# Patient Record
Sex: Male | Born: 1985 | Race: White | Hispanic: No | Marital: Married | Smoking: Never smoker
Health system: Southern US, Community
[De-identification: ages and names within clinical notes are randomized; demographics above are authoritative.]

## PROBLEM LIST (undated history)

## (undated) DIAGNOSIS — I10 Essential (primary) hypertension: Secondary | ICD-10-CM

## (undated) DIAGNOSIS — Z9289 Personal history of other medical treatment: Secondary | ICD-10-CM

## (undated) DIAGNOSIS — F32A Depression, unspecified: Secondary | ICD-10-CM

## (undated) DIAGNOSIS — Z8249 Family history of ischemic heart disease and other diseases of the circulatory system: Secondary | ICD-10-CM

## (undated) DIAGNOSIS — R079 Chest pain, unspecified: Secondary | ICD-10-CM

## (undated) DIAGNOSIS — K219 Gastro-esophageal reflux disease without esophagitis: Secondary | ICD-10-CM

## (undated) DIAGNOSIS — D352 Benign neoplasm of pituitary gland: Secondary | ICD-10-CM

## (undated) DIAGNOSIS — G4730 Sleep apnea, unspecified: Secondary | ICD-10-CM

## (undated) DIAGNOSIS — M48061 Spinal stenosis, lumbar region without neurogenic claudication: Secondary | ICD-10-CM

## (undated) DIAGNOSIS — M4800 Spinal stenosis, site unspecified: Secondary | ICD-10-CM

## (undated) DIAGNOSIS — E785 Hyperlipidemia, unspecified: Secondary | ICD-10-CM

## (undated) DIAGNOSIS — Z720 Tobacco use: Secondary | ICD-10-CM

## (undated) DIAGNOSIS — F419 Anxiety disorder, unspecified: Secondary | ICD-10-CM

## (undated) DIAGNOSIS — F329 Major depressive disorder, single episode, unspecified: Secondary | ICD-10-CM

## (undated) DIAGNOSIS — I493 Ventricular premature depolarization: Secondary | ICD-10-CM

## (undated) HISTORY — DX: Spinal stenosis, site unspecified: M48.00

## (undated) HISTORY — DX: Ventricular premature depolarization: I49.3

## (undated) HISTORY — DX: Benign neoplasm of pituitary gland: D35.2

## (undated) HISTORY — PX: EYE SURGERY: SHX253

## (undated) HISTORY — DX: Chest pain, unspecified: R07.9

## (undated) HISTORY — PX: OTHER SURGICAL HISTORY: SHX169

## (undated) HISTORY — DX: Personal history of other medical treatment: Z92.89

## (undated) HISTORY — DX: Depression, unspecified: F32.A

## (undated) HISTORY — DX: Essential (primary) hypertension: I10

## (undated) HISTORY — DX: Major depressive disorder, single episode, unspecified: F32.9

---

## 2003-10-02 ENCOUNTER — Other Ambulatory Visit: Payer: Self-pay

## 2004-05-15 ENCOUNTER — Emergency Department: Payer: Self-pay | Admitting: Emergency Medicine

## 2004-07-19 ENCOUNTER — Emergency Department: Payer: Self-pay | Admitting: Emergency Medicine

## 2004-08-11 ENCOUNTER — Emergency Department: Payer: Self-pay | Admitting: Emergency Medicine

## 2006-06-03 ENCOUNTER — Emergency Department: Payer: Self-pay | Admitting: Emergency Medicine

## 2007-03-28 ENCOUNTER — Emergency Department: Payer: Self-pay | Admitting: Unknown Physician Specialty

## 2007-07-24 ENCOUNTER — Emergency Department: Payer: Self-pay | Admitting: Emergency Medicine

## 2007-10-07 ENCOUNTER — Emergency Department: Payer: Self-pay | Admitting: Emergency Medicine

## 2007-10-07 ENCOUNTER — Other Ambulatory Visit: Payer: Self-pay

## 2008-04-13 ENCOUNTER — Emergency Department: Payer: Self-pay | Admitting: Emergency Medicine

## 2009-01-01 ENCOUNTER — Emergency Department: Payer: Self-pay | Admitting: Emergency Medicine

## 2009-07-12 ENCOUNTER — Emergency Department: Payer: Self-pay | Admitting: Internal Medicine

## 2010-04-18 ENCOUNTER — Emergency Department: Payer: Self-pay | Admitting: Emergency Medicine

## 2011-04-06 ENCOUNTER — Emergency Department: Payer: Self-pay | Admitting: *Deleted

## 2012-05-25 ENCOUNTER — Ambulatory Visit: Payer: Self-pay | Admitting: General Practice

## 2012-05-30 ENCOUNTER — Ambulatory Visit: Payer: Self-pay | Admitting: General Practice

## 2012-06-13 ENCOUNTER — Ambulatory Visit: Payer: Self-pay | Admitting: General Practice

## 2012-06-19 ENCOUNTER — Ambulatory Visit: Payer: Self-pay | Admitting: General Practice

## 2012-06-19 LAB — CREATININE, SERUM
EGFR (African American): >60
EGFR (Non-African Amer.): >60

## 2012-07-11 ENCOUNTER — Ambulatory Visit: Payer: Self-pay | Admitting: General Practice

## 2013-01-23 ENCOUNTER — Other Ambulatory Visit: Payer: Self-pay | Admitting: Emergency Medicine

## 2013-01-23 LAB — BASIC METABOLIC PANEL
Anion Gap: 6 — ABNORMAL LOW (ref 7–16)
Chloride: 105 mmol/L (ref 98–107)
Co2: 27 mmol/L (ref 21–32)
Creatinine: 1.05 mg/dL (ref 0.60–1.30)
Glucose: 100 mg/dL — ABNORMAL HIGH (ref 65–99)
Osmolality: 275 (ref 275–301)
Potassium: 3.5 mmol/L (ref 3.5–5.1)
Sodium: 138 mmol/L (ref 136–145)

## 2013-07-18 ENCOUNTER — Emergency Department: Payer: Self-pay | Admitting: Emergency Medicine

## 2013-11-06 ENCOUNTER — Ambulatory Visit: Payer: Self-pay | Admitting: General Practice

## 2013-11-19 DIAGNOSIS — M48061 Spinal stenosis, lumbar region without neurogenic claudication: Secondary | ICD-10-CM | POA: Insufficient documentation

## 2014-04-16 ENCOUNTER — Emergency Department: Payer: Self-pay | Admitting: Emergency Medicine

## 2014-04-17 LAB — COMPREHENSIVE METABOLIC PANEL
ALT: 36 U/L
AST: 19 U/L (ref 15–37)
Albumin: 3.7 g/dL (ref 3.4–5.0)
Alkaline Phosphatase: 57 U/L
Anion Gap: 7 (ref 7–16)
BUN: 13 mg/dL (ref 7–18)
Bilirubin,Total: 0.8 mg/dL (ref 0.2–1.0)
CHLORIDE: 105 mmol/L (ref 98–107)
CO2: 27 mmol/L (ref 21–32)
CREATININE: 1.01 mg/dL (ref 0.60–1.30)
Calcium, Total: 8.5 mg/dL (ref 8.5–10.1)
EGFR (African American): >60
EGFR (Non-African Amer.): >60
Glucose: 95 mg/dL (ref 65–99)
OSMOLALITY: 277 (ref 275–301)
Potassium: 3.7 mmol/L (ref 3.5–5.1)
Sodium: 139 mmol/L (ref 136–145)
Total Protein: 7.6 g/dL (ref 6.4–8.2)

## 2014-04-17 LAB — CBC WITH DIFFERENTIAL/PLATELET
BASOS ABS: 0 10*3/uL (ref 0.0–0.1)
BASOS PCT: 0.4 %
Eosinophil #: 0.1 10*3/uL (ref 0.0–0.7)
Eosinophil %: 0.7 %
HCT: 43.3 % (ref 40.0–52.0)
HGB: 14 g/dL (ref 13.0–18.0)
LYMPHS ABS: 1 10*3/uL (ref 1.0–3.6)
Lymphocyte %: 8.4 %
MCH: 28.8 pg (ref 26.0–34.0)
MCHC: 32.3 g/dL (ref 32.0–36.0)
MCV: 89 fL (ref 80–100)
MONO ABS: 0.8 x10 3/mm (ref 0.2–1.0)
Monocyte %: 7.5 %
NEUTROS PCT: 83 %
Neutrophil #: 9.4 10*3/uL — ABNORMAL HIGH (ref 1.4–6.5)
PLATELETS: 199 10*3/uL (ref 150–440)
RBC: 4.86 10*6/uL (ref 4.40–5.90)
RDW: 12.5 % (ref 11.5–14.5)
WBC: 11.3 10*3/uL — ABNORMAL HIGH (ref 3.8–10.6)

## 2014-08-12 ENCOUNTER — Emergency Department: Payer: Self-pay | Admitting: Emergency Medicine

## 2014-08-15 ENCOUNTER — Encounter: Payer: Self-pay | Admitting: Cardiovascular Disease

## 2014-08-15 ENCOUNTER — Ambulatory Visit (INDEPENDENT_AMBULATORY_CARE_PROVIDER_SITE_OTHER): Payer: No Typology Code available for payment source | Admitting: Cardiovascular Disease

## 2014-08-15 ENCOUNTER — Encounter (INDEPENDENT_AMBULATORY_CARE_PROVIDER_SITE_OTHER): Payer: Self-pay

## 2014-08-15 VITALS — BP 146/92 | HR 75 | Ht 70.0 in | Wt 301.8 lb

## 2014-08-15 DIAGNOSIS — R079 Chest pain, unspecified: Secondary | ICD-10-CM

## 2014-08-15 DIAGNOSIS — I10 Essential (primary) hypertension: Secondary | ICD-10-CM

## 2014-08-15 DIAGNOSIS — R0602 Shortness of breath: Secondary | ICD-10-CM

## 2014-08-15 MED ORDER — LISINOPRIL-HYDROCHLOROTHIAZIDE 10-12.5 MG PO TABS: 1.0000 | ORAL_TABLET | Freq: Every day | ORAL | Status: DC

## 2014-08-15 NOTE — Patient Instructions (Addendum)
Your physician has requested that you have an exercise tolerance test. For further information please visit [email or web address]. Please also follow instruction sheet, as given. -eat a small meal  -take your medications  -wear gym cloths and lace up walking shoes  -no lotion    Your physician has requested that you have an echocardiogram. Echocardiography is a painless test that uses sound waves to create images of your heart. It provides your doctor with information about the size and shape of your heart and how well your heart's chambers and valves are working. This procedure takes approximately one hour. There are no restrictions for this procedure.   Your physician has recommended you make the following change in your medication:  Restart Lisinopril/HCT 10/12.5 once daily   Your physician recommends that you schedule a follow-up appointment in:  After your tests

## 2014-08-18 DIAGNOSIS — I10 Essential (primary) hypertension: Secondary | ICD-10-CM | POA: Insufficient documentation

## 2014-08-18 NOTE — Assessment & Plan Note (Signed)
His chest pain is overall atypical and could be musculoskeletal. However, given his risk factors for coronary artery disease I requested a treadmill stress test. I discussed with the patient the importance of lifestyle changes in order to decrease the chance of future coronary artery disease and cardiovascular events. We discussed the importance of controlling risk factors, healthy diet as well as regular exercise. I also explained to him that a normal stress test does not rule out atherosclerosis.

## 2014-08-18 NOTE — Assessment & Plan Note (Signed)
I resumed lisinopril hydrochlorothiazide. I discussed with the patient the importance of regular exercise and weight loss.

## 2014-08-18 NOTE — Progress Notes (Signed)
   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 He has no previous cardiac history. He has known history of hypertension and used to be on small dose lisinopril-hydrochlorothiazide, sleep apnea currently not using CPAP, morbid obesity and tobacco dipping. He does have family history of premature coronary artery disease and his father is actually one of my patients. He had myocardial infarction at the age of 110. The patient had chest pain early this week. It was substernal radiating to his back and described as aching. 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 The episode happened at rest. He went to the emergency room at [facility]. ECG was normal. His labs were unremarkable including negative troponin and normal d-dimer. Chest x-ray showed no acute abnormalities. He complains of exertional dyspnea without chest pain with activities. He does not exercise on a regular basis.  No Known Allergies   No current outpatient prescriptions on file prior to visit.   No current facility-administered medications on file prior to visit.     Past Medical History  Diagnosis Date  . Hypertension      History reviewed. No pertinent past surgical history.   Family History  Problem Relation Age of Onset  . Heart disease Father   . Heart attack Father      History   Social History  . Marital Status: Single    Spouse Name: N/A  . Number of Children: N/A  . Years of Education: N/A   Occupational History  . Not on file.   Social History Main Topics  . Smoking status: Never Smoker   . Smokeless tobacco: Current User    Types: Chew  . Alcohol Use: Yes  . Drug Use: No  . Sexual Activity: Not on file   Other Topics Concern  . Not on file   Social History Narrative  . No narrative on file     ROS A 10 point review of system was performed. It is negative other than that mentioned in the history of present  illness.   PHYSICAL EXAM   BP 146/92 mmHg  Pulse 75  Ht 5\' 10"  (1.778 m)  Wt 301 lb 12 oz (136.873 kg)  BMI 43.30 kg/m2 Constitutional: He is oriented to person, place, and time. He appears well-developed and well-nourished. No distress.  HENT: No nasal discharge.  Head: Normocephalic and atraumatic.  Eyes: Pupils are equal and round.  No discharge. Neck: Normal range of motion. Neck supple. No JVD present. No thyromegaly present.  Cardiovascular: Normal rate, regular rhythm, normal heart sounds. Exam reveals no gallop and no friction rub. No murmur heard.  Pulmonary/Chest: Effort normal and breath sounds normal. No stridor. No respiratory distress. He has no wheezes. He has no rales. He exhibits no tenderness.  Abdominal: Soft. Bowel sounds are normal. He exhibits no distension. There is no tenderness. There is no rebound and no guarding.  Musculoskeletal: Normal range of motion. He exhibits no edema and no tenderness.  Neurological: He is alert and oriented to person, place, and time. Coordination normal.  Skin: Skin is warm and dry. No rash noted. He is not diaphoretic. No erythema. No pallor.  Psychiatric: He has a normal mood and affect. His behavior is normal. Judgment and thought content normal.       [id number]  Rhythm  WITHIN NORMAL LIMITS   ASSESSMENT AND PLAN

## 2014-08-18 NOTE — Assessment & Plan Note (Signed)
This could be due to obesity and physical deconditioning. I requested an echocardiogram to evaluate for any structural cardiac abnormalities.

## 2014-09-01 ENCOUNTER — Other Ambulatory Visit: Payer: Self-pay

## 2014-09-01 ENCOUNTER — Other Ambulatory Visit (INDEPENDENT_AMBULATORY_CARE_PROVIDER_SITE_OTHER): Payer: No Typology Code available for payment source

## 2014-09-01 ENCOUNTER — Ambulatory Visit (INDEPENDENT_AMBULATORY_CARE_PROVIDER_SITE_OTHER): Payer: No Typology Code available for payment source | Admitting: Cardiovascular Disease

## 2014-09-01 DIAGNOSIS — R079 Chest pain, unspecified: Secondary | ICD-10-CM

## 2014-09-01 DIAGNOSIS — R0602 Shortness of breath: Secondary | ICD-10-CM | POA: Diagnosis not present

## 2014-09-01 NOTE — Procedures (Signed)
   Treadmill Stress test  Indication: chest pain.  Baseline Data:  Resting EKG shows NSR with rate of 94 bpm, no significant ST or T wave changes. Resting blood pressure of 13 4/90 mm Hg Stand bruce protocal was used.  Exercise Data:  Patient exercised for 6 min 40 sec,  Peak heart rate of 169 bpm.  This was 88% of the maximum predicted heart rate. No symptoms of chest pain or lightheadedness were reported at peak stress or in recovery.  Peak Blood pressure recorded was 214/89 Maximal work level: 8 METs.  Heart rate at 3 minutes in recovery was 105 bpm. BP response: hypertensive HR response: normal  EKG with Exercise: sinus tachycardia with no significant ST changes.  FINAL IMPRESSION: Normal exercise stress test. No significant EKG changes concerning for ischemia. Below average exercise tolerance.  Recommendation: there is evidence of physical deconditioning and hypertensive response to exercise.

## 2014-09-01 NOTE — Patient Instructions (Signed)
Normal stress test

## 2014-09-04 ENCOUNTER — Ambulatory Visit (INDEPENDENT_AMBULATORY_CARE_PROVIDER_SITE_OTHER): Payer: No Typology Code available for payment source | Admitting: Cardiovascular Disease

## 2014-09-04 ENCOUNTER — Ambulatory Visit: Payer: No Typology Code available for payment source | Admitting: Cardiovascular Disease

## 2014-09-04 ENCOUNTER — Encounter: Payer: Self-pay | Admitting: Cardiovascular Disease

## 2014-09-04 VITALS — BP 138/88 | HR 74 | Ht 70.0 in | Wt 298.5 lb

## 2014-09-04 DIAGNOSIS — I10 Essential (primary) hypertension: Secondary | ICD-10-CM

## 2014-09-04 DIAGNOSIS — R0789 Other chest pain: Secondary | ICD-10-CM

## 2014-09-04 NOTE — Patient Instructions (Signed)
Follow up as needed

## 2014-09-05 ENCOUNTER — Encounter: Payer: Self-pay | Admitting: Cardiovascular Disease

## 2014-09-05 NOTE — Assessment & Plan Note (Signed)
Overall atypical. This is only happening now when he is under stress. It's not happening with physical activities. Treadmill stress test showed no evidence of ischemia. I discussed with the patient the importance of lifestyle changes in order to decrease the chance of future coronary artery disease and cardiovascular events. We discussed the importance of controlling risk factors, healthy diet as well as regular exercise. I also explained to him that a normal stress test does not rule out atherosclerosis.

## 2014-09-05 NOTE — Progress Notes (Signed)
   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 He has no previous cardiac history. He has known history of hypertension , sleep apnea currently not using CPAP, morbid obesity and tobacco dipping. He does have family history of premature coronary artery disease and his father is actually one of my patients. He was seen recently for atypical chest pain and exertional dyspnea. He underwent a treadmill stress test which showed no evidence of ischemia. There was evidence of physical deconditioning and hypertensive response to exercise. Echocardiogram was unremarkable except for mild left ventricular hypertrophy.  No Known Allergies   Current Outpatient Prescriptions on File Prior to Visit  Medication Sig Dispense Refill  . lisinopril-hydrochlorothiazide (PRINZIDE,ZESTORETIC) 10-12.5 MG per tablet Take 1 tablet by mouth daily. 30 tablet 6   No current facility-administered medications on file prior to visit.     Past Medical History  Diagnosis Date  . Hypertension      History reviewed. No pertinent past surgical history.   Family History  Problem Relation Age of Onset  . Heart disease Father   . Heart attack Father      History   Social History  . Marital Status: Single    Spouse Name: N/A  . Number of Children: N/A  . Years of Education: N/A   Occupational History  . Not on file.   Social History Main Topics  . Smoking status: Never Smoker   . Smokeless tobacco: Current User    Types: Chew  . Alcohol Use: Yes  . Drug Use: No  . Sexual Activity: Not on file   Other Topics Concern  . Not on file   Social History Narrative     ROS A 10 point review of system was performed. It is negative other than that mentioned in the history of present illness.   PHYSICAL EXAM   BP 138/88 mmHg  Pulse 74  Ht 5\' 10"  (1.778 m)  Wt 298 lb 8 oz (135.399 kg)  BMI 42.83 kg/m2 Constitutional: He is oriented to  person, place, and time. He appears well-developed and well-nourished. No distress.  HENT: No nasal discharge.  Head: Normocephalic and atraumatic.  Eyes: Pupils are equal and round.  No discharge. Neck: Normal range of motion. Neck supple. No JVD present. No thyromegaly present.  Cardiovascular: Normal rate, regular rhythm, normal heart sounds. Exam reveals no gallop and no friction rub. No murmur heard.  Pulmonary/Chest: Effort normal and breath sounds normal. No stridor. No respiratory distress. He has no wheezes. He has no rales. He exhibits no tenderness.  Abdominal: Soft. Bowel sounds are normal. He exhibits no distension. There is no tenderness. There is no rebound and no guarding.  Musculoskeletal: Normal range of motion. He exhibits no edema and no tenderness.  Neurological: He is alert and oriented to person, place, and time. Coordination normal.  Skin: Skin is warm and dry. No rash noted. He is not diaphoretic. No erythema. No pallor.  Psychiatric: He has a normal mood and affect. His behavior is normal. Judgment and thought content normal.         ASSESSMENT AND PLAN

## 2014-09-05 NOTE — Assessment & Plan Note (Signed)
Blood pressure is better controlled since he resumed Lisinopril-hydrochlorothiazide.

## 2014-09-08 ENCOUNTER — Ambulatory Visit: Payer: Self-pay | Admitting: Gastroenterology

## 2014-12-29 ENCOUNTER — Encounter: Payer: Self-pay | Admitting: General Practice

## 2014-12-29 ENCOUNTER — Emergency Department
Admission: EM | Admit: 2014-12-29 | Discharge: 2014-12-29 | Disposition: A | Discharge status: Home / Self Care | Payer: No Typology Code available for payment source | Attending: Emergency Medicine | Admitting: Emergency Medicine

## 2014-12-29 DIAGNOSIS — R1110 Vomiting, unspecified: Secondary | ICD-10-CM | POA: Diagnosis not present

## 2014-12-29 DIAGNOSIS — I10 Essential (primary) hypertension: Secondary | ICD-10-CM | POA: Diagnosis not present

## 2014-12-29 DIAGNOSIS — R109 Unspecified abdominal pain: Secondary | ICD-10-CM | POA: Diagnosis not present

## 2014-12-29 DIAGNOSIS — R509 Fever, unspecified: Secondary | ICD-10-CM | POA: Diagnosis present

## 2014-12-29 DIAGNOSIS — J039 Acute tonsillitis, unspecified: Secondary | ICD-10-CM | POA: Diagnosis not present

## 2014-12-29 LAB — CBC WITH DIFFERENTIAL/PLATELET
BASOS ABS: 0.1 10*3/uL (ref 0–0.1)
Basophils Relative: 0 %
EOS PCT: 1 %
Eosinophils Absolute: 0.1 10*3/uL (ref 0–0.7)
HEMATOCRIT: 46.2 % (ref 40.0–52.0)
HEMOGLOBIN: 15.6 g/dL (ref 13.0–18.0)
LYMPHS PCT: 8 %
Lymphs Abs: 1.3 10*3/uL (ref 1.0–3.6)
MCH: 29.7 pg (ref 26.0–34.0)
MCHC: 33.9 g/dL (ref 32.0–36.0)
MCV: 87.7 fL (ref 80.0–100.0)
MONO ABS: 1.1 10*3/uL — AB (ref 0.2–1.0)
MONOS PCT: 7 %
NEUTROS ABS: 14 10*3/uL — AB (ref 1.4–6.5)
Neutrophils Relative %: 84 %
Platelets: 218 10*3/uL (ref 150–440)
RBC: 5.26 MIL/uL (ref 4.40–5.90)
RDW: 12.8 % (ref 11.5–14.5)
WBC: 16.5 10*3/uL — AB (ref 3.8–10.6)

## 2014-12-29 LAB — COMPREHENSIVE METABOLIC PANEL
ALT: 38 U/L (ref 17–63)
ANION GAP: 12 (ref 5–15)
AST: 25 U/L (ref 15–41)
Albumin: 4.4 g/dL (ref 3.5–5.0)
Alkaline Phosphatase: 54 U/L (ref 38–126)
BUN: 15 mg/dL (ref 6–20)
CALCIUM: 9.1 mg/dL (ref 8.9–10.3)
CO2: 23 mmol/L (ref 22–32)
Chloride: 101 mmol/L (ref 101–111)
Creatinine, Ser: 0.97 mg/dL (ref 0.61–1.24)
GFR calc Af Amer: >60 mL/min (ref >60)
GFR calc non Af Amer: >60 mL/min (ref >60)
GLUCOSE: 94 mg/dL (ref 65–99)
Potassium: 3.8 mmol/L (ref 3.5–5.1)
Sodium: 136 mmol/L (ref 135–145)
TOTAL PROTEIN: 7.9 g/dL (ref 6.5–8.1)
Total Bilirubin: 1.1 mg/dL (ref 0.3–1.2)

## 2014-12-29 LAB — MONONUCLEOSIS SCREEN: MONO SCREEN: NEGATIVE

## 2014-12-29 MED ORDER — DEXAMETHASONE SODIUM PHOSPHATE 10 MG/ML IJ SOLN
10.0000 mg | Freq: Once | INTRAMUSCULAR | Status: AC
  Administered 2014-12-29: 10 mg via INTRAVENOUS

## 2014-12-29 MED ORDER — KETOROLAC TROMETHAMINE 30 MG/ML IJ SOLN
30.0000 mg | Freq: Once | INTRAMUSCULAR | Status: AC
  Administered 2014-12-29: 30 mg via INTRAVENOUS

## 2014-12-29 MED ORDER — ACETAMINOPHEN 325 MG PO TABS
650.0000 mg | ORAL_TABLET | Freq: Four times a day (QID) | ORAL | Status: DC | PRN
  Administered 2014-12-29: 650 mg via ORAL

## 2014-12-29 MED ORDER — PREDNISONE 50 MG PO TABS: ORAL_TABLET | ORAL | Status: DC

## 2014-12-29 MED ORDER — AMOXICILLIN-POT CLAVULANATE 875-125 MG PO TABS: 1.0000 | ORAL_TABLET | Freq: Two times a day (BID) | ORAL | Status: AC

## 2014-12-29 MED ORDER — SODIUM CHLORIDE 0.9 % IV SOLN
Freq: Once | INTRAVENOUS | Status: AC
  Administered 2014-12-29: 19:00:00 via INTRAVENOUS

## 2014-12-29 MED ORDER — CEFTRIAXONE SODIUM IN DEXTROSE 20 MG/ML IV SOLN
1.0000 g | Freq: Once | INTRAVENOUS | Status: AC
  Administered 2014-12-29: 1 g via INTRAVENOUS
  Filled 2014-12-29: qty 50

## 2014-12-29 MED ORDER — OXYCODONE-ACETAMINOPHEN 5-325 MG PO TABS: 1.0000 | ORAL_TABLET | Freq: Four times a day (QID) | ORAL | Status: DC | PRN

## 2014-12-29 NOTE — ED Notes (Signed)
Pt placed on med hold until 2130, Pt verbalized understanding at this time, no further needs.

## 2014-12-29 NOTE — ED Provider Notes (Signed)
[facility] Emergency Department Provider Note     Time seen: ----------------------------------------- 6:36 PM on 12/29/2014 -----------------------------------------    I have reviewed the triage vital signs and the nursing notes.   HISTORY  Chief Complaint Fever; Nausea; Emesis; and Abdominal Pain    HPI [place] DUCRE is a 29 y.o. male who presents ER for fever, nausea vomiting, sore throat and abdominal pain. Patient states had a fever since Saturday, feeling weak and having joint pains. Patient also has had ticks taking off of him, as well as some amoxicillin for possible Lyme disease. Patient denies any other complaints, is having a lot of sore throat.   Past Medical History  Diagnosis Date  . Hypertension     Patient Active Problem List   Diagnosis Date Noted  . Pain in the chest 08/18/2014  . SOB (shortness of breath) 08/18/2014  . Essential hypertension 08/18/2014    History reviewed. No pertinent past surgical history.  Allergies Review of patient's allergies indicates no known allergies.  Social History History  Substance Use Topics  . Smoking status: Never Smoker   . Smokeless tobacco: Current User    Types: Chew  . Alcohol Use: Yes    Review of Systems Constitutional: Nausea for fever. Eyes: Negative for visual changes. ENT: Positive for sore throat Cardiovascular: Negative for chest pain. Respiratory: Negative for shortness of breath. Gastrointestinal: Positive for abdominal pain and vomiting Genitourinary: Negative for dysuria. Musculoskeletal: Negative for back pain. Skin: Negative for rash. Neurological: Negative for headaches, focal weakness or numbness.  10-point ROS otherwise negative.  ____________________________________________   PHYSICAL EXAM:  VITAL SIGNS: ED Triage Vitals  Enc Vitals Group     BP 12/29/14 1808 132/91 mmHg     Pulse Rate 12/29/14 1808 116     Resp 12/29/14 1808 21     Temp  12/29/14 1808 102.5 F (39.2 C)     Temp Source 12/29/14 1808 Oral     SpO2 12/29/14 1808 94 %     Weight 12/29/14 1808 285 lb (129.275 kg)     Height 12/29/14 1808 5\' 10"  (1.778 m)     Head Cir --      Peak Flow --      Pain Score 12/29/14 1809 8     Pain Loc --      Pain Edu? --      Excl. in [place]? --     Constitutional: Alert and oriented. Well appearing and in no distress. Eyes: Conjunctivae are normal. PERRL. Normal extraocular movements. ENT   Head: Normocephalic and atraumatic.   Nose: No congestion/rhinnorhea.   Mouth/Throat: There is exudate on the tonsils, marked pharyngeal erythema   Neck: No stridor. Hematological/Lymphatic/Immunilogical positive for cervical lymphadenopathy bilaterally anteriorly. Cardiovascular: Normal rate, regular rhythm. Normal and symmetric distal pulses are present in all extremities. No murmurs, rubs, or gallops. Respiratory: Normal respiratory effort without tachypnea nor retractions. Breath sounds are clear and equal bilaterally. No wheezes/rales/rhonchi. Gastrointestinal: Soft and nontender. No distention. No abdominal bruits. There is no CVA tenderness. Musculoskeletal: Nontender with normal range of motion in all extremities. No joint effusions.  No lower extremity tenderness nor edema. Neurologic:  Normal speech and language. No gross focal neurologic deficits are appreciated. Speech is normal. No gait instability. Skin:  Skin is warm, dry and intact. No rash noted. Psychiatric: Mood and affect are normal. Speech and behavior are normal. Patient exhibits appropriate insight and judgment.  ____________________________________________  ED COURSE:  Pertinent labs & imaging  results that were available during my care of the patient were reviewed by me and considered in my medical decision making (see chart for details). Likely strep pharyngitis, we'll give normal saline, Toradol and  Decadron. ____________________________________________    LABS (pertinent positives/negatives)  Labs Reviewed  CBC WITH DIFFERENTIAL/PLATELET - Abnormal; Notable for the following:    WBC 16.5 (*)    Neutro Abs 14.0 (*)    Monocytes Absolute 1.1 (*)    All other components within normal limits  COMPREHENSIVE METABOLIC PANEL  MONONUCLEOSIS SCREEN    RADIOLOGY None  ____________________________________________  FINAL ASSESSMENT AND PLAN  Strep pharyngitis  Plan: Clinically patient was strep pharyngitis. We'll diagnosed with tonsillitis, strep clinically care testing has been negative, but will likely grow out strep culture. He is feeling better after medications including saline, Toradol, Decadron, Rocephin. He'll be discharged with Augmentin, Percocet and prednisone. He is stable for outpatient follow-up.   Earleen Newport, MD   Earleen Newport, MD 12/29/14 2033

## 2014-12-29 NOTE — Discharge Instructions (Signed)

## 2014-12-29 NOTE — ED Notes (Signed)
Pt. Arrived to ed from work. Pt verbalized that since Saturday he has been experiencing nausea & vomiting, sore throat, and RUQ pain. PT alert and oriented. Pt reports experiencing a fever since Saturday. Pt seen by clinic today and placed on amoxicillin for possible lyme disease.

## 2015-03-04 ENCOUNTER — Observation Stay
Admission: EM | Admit: 2015-03-04 | Discharge: 2015-03-05 | Disposition: A | Discharge status: Home / Self Care | Payer: PRIVATE HEALTH INSURANCE | Attending: Internal Medicine | Admitting: Internal Medicine

## 2015-03-04 ENCOUNTER — Encounter: Payer: Self-pay | Admitting: *Deleted

## 2015-03-04 DIAGNOSIS — E785 Hyperlipidemia, unspecified: Secondary | ICD-10-CM | POA: Insufficient documentation

## 2015-03-04 DIAGNOSIS — Z951 Presence of aortocoronary bypass graft: Secondary | ICD-10-CM | POA: Insufficient documentation

## 2015-03-04 DIAGNOSIS — G4730 Sleep apnea, unspecified: Secondary | ICD-10-CM | POA: Insufficient documentation

## 2015-03-04 DIAGNOSIS — Z7952 Long term (current) use of systemic steroids: Secondary | ICD-10-CM | POA: Insufficient documentation

## 2015-03-04 DIAGNOSIS — Z79899 Other long term (current) drug therapy: Secondary | ICD-10-CM | POA: Insufficient documentation

## 2015-03-04 DIAGNOSIS — I10 Essential (primary) hypertension: Secondary | ICD-10-CM | POA: Insufficient documentation

## 2015-03-04 DIAGNOSIS — Z6841 Body Mass Index (BMI) 40.0 and over, adult: Secondary | ICD-10-CM | POA: Diagnosis not present

## 2015-03-04 DIAGNOSIS — G4733 Obstructive sleep apnea (adult) (pediatric): Secondary | ICD-10-CM | POA: Insufficient documentation

## 2015-03-04 DIAGNOSIS — R079 Chest pain, unspecified: Secondary | ICD-10-CM | POA: Diagnosis not present

## 2015-03-04 DIAGNOSIS — Z79891 Long term (current) use of opiate analgesic: Secondary | ICD-10-CM | POA: Diagnosis not present

## 2015-03-04 DIAGNOSIS — Z8249 Family history of ischemic heart disease and other diseases of the circulatory system: Secondary | ICD-10-CM | POA: Insufficient documentation

## 2015-03-04 DIAGNOSIS — I2510 Atherosclerotic heart disease of native coronary artery without angina pectoris: Secondary | ICD-10-CM | POA: Insufficient documentation

## 2015-03-04 DIAGNOSIS — I209 Angina pectoris, unspecified: Secondary | ICD-10-CM

## 2015-03-04 DIAGNOSIS — I200 Unstable angina: Secondary | ICD-10-CM

## 2015-03-04 HISTORY — DX: Sleep apnea, unspecified: G47.30

## 2015-03-04 HISTORY — DX: Morbid (severe) obesity due to excess calories: E66.01

## 2015-03-04 HISTORY — DX: Family history of ischemic heart disease and other diseases of the circulatory system: Z82.49

## 2015-03-04 HISTORY — DX: Tobacco use: Z72.0

## 2015-03-04 HISTORY — DX: Hyperlipidemia, unspecified: E78.5

## 2015-03-04 LAB — CBC
HEMATOCRIT: 44.8 % (ref 40.0–52.0)
Hemoglobin: 15.1 g/dL (ref 13.0–18.0)
MCH: 29.4 pg (ref 26.0–34.0)
MCHC: 33.7 g/dL (ref 32.0–36.0)
MCV: 87.3 fL (ref 80.0–100.0)
PLATELETS: 235 10*3/uL (ref 150–440)
RBC: 5.14 MIL/uL (ref 4.40–5.90)
RDW: 12.5 % (ref 11.5–14.5)
WBC: 10.4 10*3/uL (ref 3.8–10.6)

## 2015-03-04 LAB — COMPREHENSIVE METABOLIC PANEL
ALBUMIN: 4.2 g/dL (ref 3.5–5.0)
ALT: 33 U/L (ref 17–63)
AST: 31 U/L (ref 15–41)
Alkaline Phosphatase: 58 U/L (ref 38–126)
Anion gap: 10 (ref 5–15)
BILIRUBIN TOTAL: 0.6 mg/dL (ref 0.3–1.2)
BUN: 13 mg/dL (ref 6–20)
CHLORIDE: 103 mmol/L (ref 101–111)
CO2: 26 mmol/L (ref 22–32)
Calcium: 9.6 mg/dL (ref 8.9–10.3)
Creatinine, Ser: 1.16 mg/dL (ref 0.61–1.24)
GFR calc Af Amer: >60 mL/min (ref >60)
GFR calc non Af Amer: >60 mL/min (ref >60)
GLUCOSE: 113 mg/dL — AB (ref 65–99)
POTASSIUM: 3.6 mmol/L (ref 3.5–5.1)
Sodium: 139 mmol/L (ref 135–145)
Total Protein: 7.6 g/dL (ref 6.5–8.1)

## 2015-03-04 LAB — MAGNESIUM: Magnesium: 2.3 mg/dL (ref 1.7–2.4)

## 2015-03-04 LAB — LIPID PANEL
Cholesterol: 169 mg/dL (ref 0–200)
HDL: 28 mg/dL — ABNORMAL LOW (ref >40)
LDL CALC: 106 mg/dL — AB (ref 0–99)
TRIGLYCERIDES: 174 mg/dL — AB (ref <150)
Total CHOL/HDL Ratio: 6 RATIO
VLDL: 35 mg/dL (ref 0–40)

## 2015-03-04 LAB — TROPONIN I: Troponin I: <0.03 ng/mL (ref <0.031)

## 2015-03-04 MED ORDER — LISINOPRIL-HYDROCHLOROTHIAZIDE 10-12.5 MG PO TABS: 1.0000 | ORAL_TABLET | Freq: Every day | ORAL | Status: DC

## 2015-03-04 MED ORDER — HYDROCHLOROTHIAZIDE 12.5 MG PO CAPS
12.5000 mg | ORAL_CAPSULE | Freq: Every day | ORAL | Status: DC
  Administered 2015-03-05: 12.5 mg via ORAL
  Filled 2015-03-04: qty 1

## 2015-03-04 MED ORDER — ENOXAPARIN SODIUM 100 MG/ML ~~LOC~~ SOLN
100.0000 mg | Freq: Once | SUBCUTANEOUS | Status: AC
  Administered 2015-03-04: 100 mg via SUBCUTANEOUS
  Filled 2015-03-04: qty 1

## 2015-03-04 MED ORDER — LISINOPRIL 10 MG PO TABS
10.0000 mg | ORAL_TABLET | Freq: Every day | ORAL | Status: DC
  Administered 2015-03-05: 10 mg via ORAL
  Filled 2015-03-04: qty 1

## 2015-03-04 MED ORDER — ASPIRIN EC 325 MG PO TBEC
325.0000 mg | DELAYED_RELEASE_TABLET | Freq: Every day | ORAL | Status: DC
  Administered 2015-03-05: 325 mg via ORAL
  Filled 2015-03-04 (×2): qty 1

## 2015-03-04 MED ORDER — NITROGLYCERIN 0.4 MG SL SUBL: 0.4000 mg | SUBLINGUAL_TABLET | SUBLINGUAL | Status: DC | PRN

## 2015-03-04 MED ORDER — ASPIRIN 81 MG PO CHEW
324.0000 mg | CHEWABLE_TABLET | Freq: Once | ORAL | Status: AC
  Administered 2015-03-04: 324 mg via ORAL
  Filled 2015-03-04: qty 4

## 2015-03-04 NOTE — ED Notes (Addendum)
Pt to triage via wheelchair.  Pt has chest pain for 3 days.  Pt states pain worse today.  Pt has intermittent sob.  Nonsmoker.  Pt reports pain across the chest and into back.  Pt states no bp meds in 5 weeks.

## 2015-03-04 NOTE — ED Provider Notes (Signed)
[facility] Emergency Department Provider Note  ____________________________________________  Time seen: Approximately 7:58 PM  I have reviewed the triage vital signs and the nursing notes.   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  He states he has been having sharp intermittent chest pain for about 3 days.  It has been becoming more frequent and is now occurring at rest as well as worse with exertion.  He is also having dizziness, nausea, weakness, and diaphoresis.  He has hypertension, hyperlipidemia, and uses tobacco.  He has not been compliant with his blood pressure medication for weeks.  Occasionally the chest pain radiates down to his right arm.  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  It is currently better.  He got a full dose aspirin upon arrival in the emergency department.  Of note, his family history is notable for his father having a massive MI requiring coronary artery bypass at age 57.   Past Medical History  Diagnosis Date  . Hypertension   . Hyperlipidemia     Patient Active Problem List   Diagnosis Date Noted  . Unstable angina 03/04/2015  . Pain in the chest 08/18/2014  . SOB (shortness of breath) 08/18/2014  . Essential hypertension 08/18/2014    No past surgical history on file.  No current outpatient prescriptions on file.  Allergies Review of patient's allergies indicates no known allergies.  Family History  Problem Relation Age of Onset  . Heart disease Father   . Heart attack Father 19    Social History Social History  Substance Use Topics  . Smoking status: Never Smoker   . Smokeless tobacco: Current User    Types: Chew  . Alcohol Use: Yes    Review of Systems Constitutional: No fever/chills Eyes: No visual changes. ENT:  No sore throat. Cardiovascular: Chest pain as described above Respiratory: Shortness of breath Gastrointestinal: No abdominal pain.  Nausea, no vomiting.  No diarrhea.  No constipation. Genitourinary: Negative for dysuria. Musculoskeletal: Negative for back pain. Skin: Negative for rash. Neurological: Negative for headaches, focal weakness or numbness.  10-point ROS otherwise negative.  ____________________________________________   PHYSICAL EXAM:  VITAL SIGNS: ED Triage Vitals  Enc Vitals Group     BP 03/04/15 1824 160/108 mmHg     Pulse Rate 03/04/15 1824 94     Resp 03/04/15 1824 20     Temp 03/04/15 1824 98.6 F (37 C)     Temp Source 03/04/15 1824 Oral     SpO2 03/04/15 1824 99 %     Weight 03/04/15 1824 294 lb (133.358 kg)     Height 03/04/15 1824 5\' 10"  (1.778 m)     Head Cir --      Peak Flow --      Pain Score 03/04/15 1829 9     Pain Loc --      Pain Edu? --      Excl. in [place]? --     Constitutional: Alert and oriented. Well appearing and in no acute distress. Eyes: Conjunctivae are normal. PERRL. EOMI. Head: Atraumatic. Nose: No congestion/rhinnorhea. Mouth/Throat: Mucous membranes are moist.  Oropharynx non-erythematous. Neck: No stridor.   Cardiovascular: Normal rate, regular rhythm. Grossly normal heart sounds.  Good peripheral circulation. Respiratory: Normal  respiratory effort.  No retractions. Lungs CTAB. Gastrointestinal: Soft and nontender. No distention. No abdominal bruits. No CVA tenderness. Musculoskeletal: No lower extremity tenderness nor edema.  No joint effusions. Neurologic:  Normal speech and language. No gross focal neurologic deficits are appreciated.  Skin:  Skin is warm, dry and intact. No rash noted.   ____________________________________________   LABS (all labs ordered are listed, but only abnormal results are displayed)  Labs Reviewed  COMPREHENSIVE METABOLIC PANEL - Abnormal; Notable for the following:    Glucose, Bld 113  (*)    All other components within normal limits  CBC  MAGNESIUM  TROPONIN I  HEMOGLOBIN A1C  LIPID PANEL  TROPONIN I  TROPONIN I  TROPONIN I   ____________________________________________  EKG  ED ECG REPORT I, Mindel Friscia, the attending physician, personally viewed and interpreted this ECG.  Date: 03/04/2015 EKG Time: 18:28 Rate: 94 Rhythm: normal sinus rhythm QRS Axis: normal Intervals: normal ST/T Wave abnormalities: normal Conduction Disutrbances: none Narrative Interpretation: unremarkable  ____________________________________________  RADIOLOGY   No results found.  ____________________________________________   PROCEDURES  Procedure(s) performed: None  Critical Care performed: No ____________________________________________   INITIAL IMPRESSION / ASSESSMENT AND PLAN / ED COURSE  Pertinent labs & imaging results that were available during my care of the patient were reviewed by me and considered in my medical decision making (see chart for details).  The patient has numerous risk factors for coronary artery disease.  Although he is young, his father had a massive heart attack at a very early age as well.  I am concerned that his symptoms may represent at a minimum ischemic chest pain, and may possibly represent unstable angina.  I discussed it with the patient and he prefers a chest pain observation versus admission given how worried he is about his rapidly worsening symptoms.  I discussed with the hospitalist and we discussed starting heparin versus observation, and they prefer that I defer the order at this time and they will decide after they see the patient for admission.  ____________________________________________  FINAL CLINICAL IMPRESSION(S) / ED DIAGNOSES  Final diagnoses:  Ischemic chest pain      NEW MEDICATIONS STARTED DURING THIS VISIT:  Current Discharge Medication List       Hinda Kehr, MD 03/04/15 2206

## 2015-03-04 NOTE — ED Notes (Signed)
Pt states intermetient chest pain for 3 days, states dizzines, nausea, and weakness, pt states he has not taken his BP meds in several weeks, pt also complains of pain in right hand, pt alert and oriented in no distress, pt appears to be in no distress, respirations even and unlabored

## 2015-03-04 NOTE — Plan of Care (Signed)
Problem: Phase I Progression Outcomes Goal: Anginal pain relieved Outcome: Not Met (add Reason) Patient having intermittent anginal pain.  Patient states that it lasts a very short length of time and then goes away.

## 2015-03-04 NOTE — H&P (Signed)
[facility] [place] NAME: Seth Parker    MR#:  [id number]  DATE OF BIRTH:  10-04-1985  DATE OF ADMISSION:  03/04/2015  PRIMARY CARE PHYSICIAN: Versie Starks, PA-C   REQUESTING/REFERRING PHYSICIAN: Marcos Eke  CHIEF COMPLAINT:   Chief Complaint  Patient presents with  . Chest Pain    HISTORY OF PRESENT ILLNESS: Seth Parker  is a 29 y.o. male with a known history of hypertension, morbid obesity, active use of tobacco by chewing- he had complain of on and off chest pain 5 months ago and he was seen by Dr. Solmon Ice in the office treadmill stress test and echocardiogram was done which were negative so he was advised to do lifestyle modification and risk reduction. He works as Press photographer, for last 3 days he has pain in the center of his chest, not related to any activities, sharp and sometimes likes stabbing and sometimes pressure-like and radiating to his left shoulder, and much worse than what he had 5-6 month ago. This pain has been happening multiple times in a day, so finally he decided to come to emergency room today. His father had MI at age of 54, and had bypass surgery. Because of prolonged history suggestive of coronary event he is given his admission to hospitalist team for further management.  PAST MEDICAL HISTORY:   Past Medical History  Diagnosis Date  . Hypertension   . Hyperlipidemia     PAST SURGICAL HISTORY: No past surgical history on file.  SOCIAL HISTORY:  Social History  Substance Use Topics  . Smoking status: Never Smoker   . Smokeless tobacco: Current User    Types: Chew  . Alcohol Use: Yes    FAMILY HISTORY:  Family History  Problem Relation Age of Onset  . Heart disease Father   . Heart attack Father 55    DRUG ALLERGIES: No Known Allergies  REVIEW OF SYSTEMS:   CONSTITUTIONAL: No fever, fatigue or weakness.  EYES: No blurred or double vision.  EARS, NOSE, AND THROAT: No tinnitus or ear pain.   RESPIRATORY: No cough, shortness of breath, wheezing or hemoptysis.  CARDIOVASCULAR: positive for chest pain,no orthopnea, edema.  GASTROINTESTINAL: No nausea, vomiting, diarrhea or abdominal pain.  GENITOURINARY: No dysuria, hematuria.  ENDOCRINE: No polyuria, nocturia,  HEMATOLOGY: No anemia, easy bruising or bleeding SKIN: No rash or lesion. MUSCULOSKELETAL: No joint pain or arthritis.   NEUROLOGIC: No tingling, numbness, weakness.  PSYCHIATRY: No anxiety or depression.   MEDICATIONS AT HOME:  Prior to Admission medications   Medication Sig Start Date End Date Taking? Authorizing Provider  lisinopril-hydrochlorothiazide (PRINZIDE,ZESTORETIC) 10-12.5 MG per tablet Take 1 tablet by mouth daily.   Yes Historical Provider, MD  oxyCODONE-acetaminophen (ROXICET) 5-325 MG per tablet Take 1 tablet by mouth every 6 (six) hours as needed. Patient not taking: Reported on 03/04/2015 12/29/14   Earleen Newport, MD  predniSONE (DELTASONE) 50 MG tablet One tablet by mouth daily for 4 days Patient not taking: Reported on 03/04/2015 12/29/14   Earleen Newport, MD      PHYSICAL EXAMINATION:   VITAL SIGNS: Blood pressure 156/90, pulse 79, temperature 98.6 F (37 C), temperature source Oral, resp. rate 16, height 5\' 10"  (1.778 m), weight 133.358 kg (294 lb), SpO2 97 %.  GENERAL:  29 y.o.-year-old obase patient lying in the bed with no acute distress.  EYES: Pupils equal, round, reactive to light and accommodation. No scleral icterus. Extraocular muscles intact.  HEENT: Head  atraumatic, normocephalic. Oropharynx and nasopharynx clear.  NECK:  Supple, no jugular venous distention. No thyroid enlargement, no tenderness.  LUNGS: Normal breath sounds bilaterally, no wheezing, rales,rhonchi or crepitation. No use of accessory muscles of respiration.  CARDIOVASCULAR: S1, S2 normal. No murmurs, rubs, or gallops. No tenderness on local palpation on sternum. ABDOMEN: Soft, nontender, nondistended. Bowel  sounds present. No organomegaly or mass.  EXTREMITIES: No pedal edema, cyanosis, or clubbing.  NEUROLOGIC: Cranial nerves II through XII are intact. Muscle strength 5/5 in all extremities. Sensation intact. Gait not checked.  PSYCHIATRIC: The patient is alert and oriented x 3.  SKIN: No obvious rash, lesion, or ulcer.   LABORATORY PANEL:   CBC  Recent Labs Lab 03/04/15 1843  WBC 10.4  HGB 15.1  HCT 44.8  PLT 235  MCV 87.3  MCH 29.4  MCHC 33.7  RDW 12.5   ------------------------------------------------------------------------------------------------------------------  Chemistries   Recent Labs Lab 03/04/15 1843  NA 139  K 3.6  CL 103  CO2 26  GLUCOSE 113*  BUN 13  CREATININE 1.16  CALCIUM 9.6  MG 2.3  AST 31  ALT 33  ALKPHOS 58  BILITOT 0.6   ------------------------------------------------------------------------------------------------------------------ estimated creatinine clearance is 126.9 mL/min (by C-G formula based on Cr of 1.16). ------------------------------------------------------------------------------------------------------------------ No results for input(s): TSH, T4TOTAL, T3FREE, THYROIDAB in the last 72 hours.  Invalid input(s): FREET3   Coagulation profile No results for input(s): INR, PROTIME in the last 168 hours. ------------------------------------------------------------------------------------------------------------------- No results for input(s): DDIMER in the last 72 hours. -------------------------------------------------------------------------------------------------------------------  Cardiac Enzymes  Recent Labs Lab 03/04/15 1843  TROPONINI <0.03   ------------------------------------------------------------------------------------------------------------------ Invalid input(s): POCBNP  ---------------------------------------------------------------------------------------------------------------  Urinalysis No  results found for: COLORURINE, APPEARANCEUR, LABSPEC, PHURINE, GLUCOSEU, HGBUR, BILIRUBINUR, KETONESUR, PROTEINUR, UROBILINOGEN, NITRITE, LEUKOCYTESUR   RADIOLOGY: No results found.  EKG: NSR  IMPRESSION AND PLAN:  * Unstable angina Because of very strong history he is at high risk of having coronary artery disease, so will give one dose of Lovenox subcutaneous therapeutic. Monitor on telemetry with serial troponin, get cardiology consult tomorrow morning. Nitroglycerin sublingual tablet for any more chest pain episodes, and keep nothing by mouth after midnight for possible intervention tomorrow.  * Hypertension  Continue home medications for now.  * Sleep apnea  He uses CPAP at home.  * Tobacco abuse-chewing  Counseled to quit for 4 minutes and offered nicotine patch he said he will be fine without that.  All the records are reviewed and case discussed with ED provider. Management plans discussed with the patient, family and they are in agreement.  CODE STATUS: full   TOTAL TIME TAKING CARE OF THIS PATIENT: 50 minutes.    Vaughan Basta M.D on 03/04/2015   Between 7am to 6pm - Pager - [phone]  After 6pm go to [email or web address] - password [email or web address]  Office  [phone]  CC: Primary care physician; Versie Starks, PA-C

## 2015-03-05 ENCOUNTER — Encounter: Payer: Self-pay | Admitting: Physician Assistant

## 2015-03-05 DIAGNOSIS — R0789 Other chest pain: Secondary | ICD-10-CM

## 2015-03-05 LAB — BASIC METABOLIC PANEL
Anion gap: 5 (ref 5–15)
BUN: 15 mg/dL (ref 6–20)
CHLORIDE: 106 mmol/L (ref 101–111)
CO2: 30 mmol/L (ref 22–32)
CREATININE: 1.05 mg/dL (ref 0.61–1.24)
Calcium: 8.9 mg/dL (ref 8.9–10.3)
GFR calc Af Amer: >60 mL/min (ref >60)
GFR calc non Af Amer: >60 mL/min (ref >60)
Glucose, Bld: 95 mg/dL (ref 65–99)
Potassium: 3.4 mmol/L — ABNORMAL LOW (ref 3.5–5.1)
SODIUM: 141 mmol/L (ref 135–145)

## 2015-03-05 LAB — HEMOGLOBIN A1C: Hgb A1c MFr Bld: 5.1 % (ref 4.0–6.0)

## 2015-03-05 LAB — CBC
HCT: 42.1 % (ref 40.0–52.0)
Hemoglobin: 14.1 g/dL (ref 13.0–18.0)
MCH: 29.5 pg (ref 26.0–34.0)
MCHC: 33.4 g/dL (ref 32.0–36.0)
MCV: 88.2 fL (ref 80.0–100.0)
PLATELETS: 204 10*3/uL (ref 150–440)
RBC: 4.77 MIL/uL (ref 4.40–5.90)
RDW: 12.7 % (ref 11.5–14.5)
WBC: 7.6 10*3/uL (ref 3.8–10.6)

## 2015-03-05 LAB — TROPONIN I
Troponin I: <0.03 ng/mL (ref <0.031)
Troponin I: <0.03 ng/mL (ref <0.031)

## 2015-03-05 MED ORDER — POTASSIUM CHLORIDE CRYS ER 20 MEQ PO TBCR
40.0000 meq | EXTENDED_RELEASE_TABLET | Freq: Once | ORAL | Status: AC
  Administered 2015-03-05: 40 meq via ORAL
  Filled 2015-03-05: qty 2

## 2015-03-05 MED ORDER — PANTOPRAZOLE SODIUM 40 MG PO TBEC
40.0000 mg | DELAYED_RELEASE_TABLET | Freq: Two times a day (BID) | ORAL | Status: DC
  Administered 2015-03-05: 40 mg via ORAL
  Filled 2015-03-05: qty 1

## 2015-03-05 MED ORDER — PANTOPRAZOLE SODIUM 40 MG PO TBEC: 40.0000 mg | DELAYED_RELEASE_TABLET | Freq: Every day | ORAL | Status: DC

## 2015-03-05 NOTE — Progress Notes (Signed)
Spoke to [facility] stated that it was ok for pt to be d/c but needed to followup. Room air. NSR. Takes meds ok. A & O. No pain. Discharge instructions reviewed wit the pt. IV and tele removed. Pt has no further concerns at this time.

## 2015-03-05 NOTE — Discharge Instructions (Signed)
°  DIET:  Low na+ diet  DISCHARGE CONDITION:  Stable  ACTIVITY:  Activity as tolerated  OXYGEN:  Home Oxygen: No.   Oxygen Delivery: room air  DISCHARGE LOCATION:  home    ADDITIONAL DISCHARGE INSTRUCTION:   If you experience worsening of your admission symptoms, develop shortness of breath, life threatening emergency, suicidal or homicidal thoughts you must seek medical attention immediately by calling 911 or calling your MD immediately  if symptoms less severe.  You Must read complete instructions/literature along with all the possible adverse reactions/side effects for all the Medicines you take and that have been prescribed to you. Take any new Medicines after you have completely understood and accpet all the possible adverse reactions/side effects.   Please note  You were cared for by a hospitalist during your hospital stay. If you have any questions about your discharge medications or the care you received while you were in the hospital after you are discharged, you can call the unit and asked to speak with the hospitalist on call if the hospitalist that took care of you is not available. Once you are discharged, your primary care physician will handle any further medical issues. Please note that NO REFILLS for any discharge medications will be authorized once you are discharged, as it is imperative that you return to your primary care physician (or establish a relationship with a primary care physician if you do not have one) for your aftercare needs so that they can reassess your need for medications and monitor your lab values.

## 2015-03-05 NOTE — Consult Note (Signed)
Cardiology Consultation Note  Patient ID: CHADRICK SPRINKLE, MRN: [id number], DOB/AGE: 1985/12/09 29 y.o. Admit date: 03/04/2015   Date of Consult: 03/05/2015 Primary Physician: Versie Starks, PA-C Primary Cardiologist: Dr. Fletcher Anon, MD  Chief Complaint: Chest pain Reason for Consult: Chest pain  HPI: 29 y.o. male with h/o family history of premature CAD, HTN, HLD, sleep apnea not on CPAP, and morbid obesity who has not prior known cardiac history who presents to [facility] on 8/31 with 3 days of intermittent chest pain.   He presented to [facility] back in the winter of 2016 with 5-6 month history of chest pain after going to the ED and having normal ECG, negative troponin and negative d-dimer. He underwent treadmill stress test that showed no evidence of ischemia, hypertensive BP response, and physical deconditioning. Echo showed EF 60-65%, normal wall motion, and normal diastolic function. His father had an MI at age 50 s/p CABG.   The patient presented to [facility] on 8/31 with 3 day history of intermittent chest pain that are not related to any activities, and described as sharp and stabbing like. Pain was stated to be much worse than 5-6 months ago. Pain is worse with food consumption. Not relieved with TUMs. He has never seen GI previously. This has been happening several times daily. He was standing in his yard watching his brother ride a tractor on 8/31 when the pain worsened prompting him to come in for evaluation. Some associated nausea, diaphoresis, and dizziness.   Upon his arrival to [facility] he had negative troponin x 3, ECG with NSR, 94 bpm, no acute changes, hypokalemia of 3.4, unremarkable CBC, Mg++ 2.3, CXR not performed. Today, he is chest pain free.        Past Medical History  Diagnosis Date  . Hypertension   . Hyperlipidemia   . Sleep apnea     a. not on CPAP  . Morbid obesity   . Tobacco abuse     a. dipping  . Family history of premature CAD     a. in father with CAD s/p CABG in early 71s        Most Recent Cardiac Studies: Echo 09/01/2014:  Study Conclusions  - Left ventricle: The cavity size was normal. There was mild concentric hypertrophy. Systolic function was normal. The estimated ejection fraction was in the range of 60% to 65%. Wall motion was normal; there were no regional wall motion abnormalities. Left ventricular diastolic function parameters were normal.  Impressions:  - Normal study.  Treadmill stress test 08/2014:  No evidence of ischemia with evidence of physical deconditioning and hypertensive response to exercise.    Surgical History: No past surgical history on file.   Home Meds: Prior to Admission medications   Medication Sig Start Date End Date Taking? Authorizing Provider  lisinopril-hydrochlorothiazide (PRINZIDE,ZESTORETIC) 10-12.5 MG per tablet Take 1 tablet by mouth daily.   Yes Historical Provider, MD  oxyCODONE-acetaminophen (ROXICET) 5-325 MG per tablet Take 1 tablet by mouth every 6 (six) hours as needed. Patient not taking: Reported on 03/04/2015 12/29/14   Earleen Newport, MD  predniSONE (DELTASONE) 50 MG tablet One tablet by mouth daily for 4 days Patient not taking: Reported on 03/04/2015 12/29/14   Earleen Newport, MD    Inpatient Medications:  . aspirin EC  325 mg Oral Daily  . lisinopril  10 mg Oral Daily   And  . hydrochlorothiazide  12.5 mg Oral Daily      Allergies: No Known Allergies  Social History   Social History  . Marital Status: Single    Spouse Name: N/A  . Number of Children: N/A  . Years of Education: N/A   Occupational History  . Not on file.   Social History Main Topics  . Smoking status: Never Smoker   . Smokeless tobacco: Current User    Types: Chew  . Alcohol Use: Yes  . Drug Use: No  . Sexual Activity: Not on file   Other Topics Concern  . Not on file   Social History Narrative     Family History  Problem Relation Age of Onset  . Heart disease Father   . Heart  attack Father 12     Review of Systems: Review of Systems  Constitutional: Positive for diaphoresis. Negative for fever, chills, weight loss and malaise/fatigue.  HENT: Negative for congestion.   Eyes: Negative for discharge and redness.  Respiratory: Negative for cough, hemoptysis, sputum production, shortness of breath and wheezing.   Cardiovascular: Positive for chest pain. Negative for palpitations, orthopnea, claudication, leg swelling and PND.  Gastrointestinal: Positive for heartburn and nausea. Negative for vomiting, abdominal pain, constipation, blood in stool and melena.  Musculoskeletal: Negative for falls.  Neurological: Positive for dizziness. Negative for sensory change, speech change, focal weakness, weakness and headaches.  Endo/Heme/Allergies: Does not bruise/bleed easily.  Psychiatric/Behavioral: Positive for substance abuse. The patient is nervous/anxious.   All other systems reviewed and are negative.   Labs:  Recent Labs  03/04/15 1843 03/05/15 0212 03/05/15 0505  TROPONINI <0.03 <0.03 <0.03   Lab Results  Component Value Date   WBC 7.6 03/05/2015   HGB 14.1 03/05/2015   HCT 42.1 03/05/2015   MCV 88.2 03/05/2015   PLT 204 03/05/2015     Recent Labs Lab 03/04/15 1843 03/05/15 0505  NA 139 141  K 3.6 3.4*  CL 103 106  CO2 26 30  BUN 13 15  CREATININE 1.16 1.05  CALCIUM 9.6 8.9  PROT 7.6  --   BILITOT 0.6  --   ALKPHOS 58  --   ALT 33  --   AST 31  --   GLUCOSE 113* 95   Lab Results  Component Value Date   CHOL 169 03/04/2015   HDL 28* 03/04/2015   LDLCALC 106* 03/04/2015   TRIG 174* 03/04/2015   No results found for: DDIMER  Radiology/Studies:  No results found.  EKG: NSR, 94 bpm, no st/t changes  Weights: Filed Weights   03/04/15 1824 03/04/15 2150  Weight: 294 lb (133.358 kg) 293 lb 6.4 oz (133.085 kg)     Physical Exam: Blood pressure 132/77, pulse 64, temperature 97.6 F (36.4 C), temperature source Oral, resp. rate  17, height 5\' 10"  (1.778 m), weight 293 lb 6.4 oz (133.085 kg), SpO2 99 %. Body mass index is 42.1 kg/(m^2). General: Well developed, well nourished, in no acute distress. Head: Normocephalic, atraumatic, sclera non-icteric, no xanthomas, nares are without discharge.  Neck: Negative for carotid bruits. JVD not elevated. Lungs: Clear bilaterally to auscultation without wheezes, rales, or rhonchi. Breathing is unlabored. Heart: RRR with S1 S2. No murmurs, rubs, or gallops appreciated. Abdomen: Obese, soft, non-tender, non-distended with normoactive bowel sounds. No hepatomegaly. No rebound/guarding. No obvious abdominal masses. Msk:  Strength and tone appear normal for age. Extremities: No clubbing or cyanosis. No edema.  Distal pedal pulses are 2+ and equal bilaterally. Neuro: Alert and oriented X 3. No facial asymmetry. No focal deficit. Moves all extremities spontaneously. Psych:  Responds to questions appropriately with a normal affect.    Assessment and Plan:  29 y.o. male with h/o family history of premature CAD, HTN, HLD, sleep apnea not on CPAP, and morbid obesity who has not prior known cardiac history who presents to [facility] on 8/31 with 3 days of intermittent chest pain.   1. Chest pain: -Patient with significant family history of premature CAD in his father -Recent evaluation via treadmill stress test that showed no ischemia, hypertensive response, and physical deconditioning and echo that was normal -First episode of symptoms since he was last seen at our office -Symptoms associated with food bolus passing, and non-exertional  -Given his family history consider further ischemic evaluation (his father apparently had "normal" nuclear stress tests in the setting of 98% stenosis of the LAD) via nuclear stress vs diagnotic cardiac cath, will leave to MD to decide  -Symptoms could also be exacerbated in the setting of accelerated HTN -Would also suggest GI evaluation given his food  association  -Currently symptom free -Recent echo as above, no need to update  3. HTN:  -Presented with BP in the 962I systolic  -Improved to the 297L systolic currently -Continue current treatment  3. HLD: -Lifestyle management  4. Sleep apnea: -Not on CPAP -Recommend outpatient follow up with PCP  5. Morbid obesity: -Lifestyle modification    Signed, Christell Faith, PA-C Pager: [phone] 03/05/2015, 9:26 AM

## 2015-03-05 NOTE — Consult Note (Signed)
Consultation  Referring Provider:     Dr Seth Parker Admit date 03/04/15 Consult date        03/05/15 Reason for Consultation:   Substernal chest pain           HPI:   Seth Parker is a 29 y.o. male  With history of OSA, HTN. HL, morbid obesity, and tobacco abuse. Was admitted with substernal chest pain yesterday. This has been a recurrent ongoing issue for him:: follows with Dr Seth Parker. Had unremarkable stress test and echo 46m ago. Troponins have been negative here x 3. Has been evaluated by cardiology service this admission, and no cardiac cause found. Patient reports intermittent chest pain over the last few months, associated with eating, hiccuping, and burping. Also has odynophagia, heartburn,  and intermittent nausea but not vomiting. Uses 2 cans of oral tobacco qd,  Takes Ibupropfen 800mg  po maybe twice a month for intermittent headaches. Not taking PPI at home on a regular basis. Denies abdominal pain, melena/hematochezia, further GI complaints. States he did have a colonoscopy earlier this year for abdominal pain/constipation by Dr Seth Parker with the finding of a polyp; states his constipation and abdominal pain has since resolved. There is no history of EGD. Brother had ulcers. No one in family with gallbladder issues. Has had ASA 325mg  po today and was given 100mg  lovenox yesterday. No pain today   Past Medical History  Diagnosis Date  . Hypertension   . Hyperlipidemia   . Sleep apnea     a. not on CPAP  . Morbid obesity   . Tobacco abuse     a. dipping  . Family history of premature CAD     a. in father with CAD s/p CABG in early 6s    No past surgical history on file.  Family History  Problem Relation Age of Onset  . Heart disease Father   . Heart attack Father 8  Brother with ulcers, no gallbladder disease, colorectal cancer, colon polyps  Social History  Substance Use Topics  . Smoking status: Never Smoker   . Smokeless tobacco: Current User    Types: Chew  . Alcohol Use: Yes     Prior to Admission medications   Medication Sig Start Date End Date Taking? Authorizing Provider  lisinopril-hydrochlorothiazide (PRINZIDE,ZESTORETIC) 10-12.5 MG per tablet Take 1 tablet by mouth daily.   Yes Historical Provider, MD  oxyCODONE-acetaminophen (ROXICET) 5-325 MG per tablet Take 1 tablet by mouth every 6 (six) hours as needed. Patient not taking: Reported on 03/04/2015 12/29/14   Seth Newport, MD  pantoprazole (PROTONIX) 40 MG tablet Take 1 tablet (40 mg total) by mouth daily. 03/05/15   Seth Flock, MD  predniSONE (DELTASONE) 50 MG tablet One tablet by mouth daily for 4 days Patient not taking: Reported on 03/04/2015 12/29/14   Seth Newport, MD    Current Facility-Administered Medications  Medication Dose Route Frequency Provider Last Rate Last Dose  . aspirin EC tablet 325 mg  325 mg Oral Daily Seth Basta, MD   325 mg at 03/05/15 1057  . lisinopril (PRINIVIL,ZESTRIL) tablet 10 mg  10 mg Oral Daily Seth Basta, MD   10 mg at 03/05/15 1057   And  . hydrochlorothiazide (MICROZIDE) capsule 12.5 mg  12.5 mg Oral Daily Seth Basta, MD   12.5 mg at 03/05/15 1057  . nitroGLYCERIN (NITROSTAT) SL tablet 0.4 mg  0.4 mg Sublingual Q5 min PRN Seth Basta, MD      . pantoprazole (PROTONIX) EC tablet  40 mg  40 mg Oral BID Seth Flock, MD   40 mg at 03/05/15 1057    Allergies as of 03/04/2015  . (No Known Allergies)     Review of Systems:    All systems reviewed and negative except where noted in HPI.    Physical Exam:  Vital signs in last 24 hours: Temp:  [97.5 F (36.4 C)-98.9 F (37.2 C)] 97.6 F (36.4 C) (09/01 0737) Pulse Rate:  [61-94] 67 (09/01 1145) Resp:  [16-26] 18 (09/01 1145) BP: (117-162)/(72-108) 117/81 mmHg (09/01 1145) SpO2:  [96 %-100 %] 99 % (09/01 0737) Weight:  [133.085 kg (293 lb 6.4 oz)-133.358 kg (294 lb)] 133.085 kg (293 lb 6.4 oz) (08/31 2150) Last BM Date: 03/04/15 General:   Pleasant young  man  in NAD Head:  Normocephalic and atraumatic. Eyes:   No icterus.   Conjunctiva pink. Ears:  Normal auditory acuity. Mouth: Mucosa pink moist, no lesions. Neck:  Supple; no masses felt Lungs:  Respirations even and unlabored. Lungs clear to auscultation bilaterally.   No wheezes, crackles, or rhonchi.  Heart:  S1S2, RRR, no MRG. No edema. Abdomen:   Flat, soft, nondistended, nontender. Normal bowel sounds. No appreciable masses or hepatomegaly. No rebound signs or other peritoneal signs. Rectal:  Not performed.  Msk:  MAEW x4, No clubbing or cyanosis. Strength 5/5. Symmetrical without gross deformities. Neurologic:  Alert and  oriented x4;  Cranial nerves II-XII intact.  Skin:  Warm, dry, pink without significant lesions or rashes. Psych:  Alert and cooperative. Normal affect.  LAB RESULTS:  Recent Labs  03/04/15 1843 03/05/15 0505  WBC 10.4 7.6  HGB 15.1 14.1  HCT 44.8 42.1  PLT 235 204   BMET  Recent Labs  03/04/15 1843 03/05/15 0505  NA 139 141  K 3.6 3.4*  CL 103 106  CO2 26 30  GLUCOSE 113* 95  BUN 13 15  CREATININE 1.16 1.05  CALCIUM 9.6 8.9   LFT  Recent Labs  03/04/15 1843  PROT 7.6  ALBUMIN 4.2  AST 31  ALT 33  ALKPHOS 58  BILITOT 0.6   PT/INR No results for input(s): LABPROT, INR in the last 72 hours.  STUDIES: No results found.     Impression / Plan:   1. Atypical chest pain. Esophagitis is certainly in his DDx. Recommend replacing tobacco habit with healthier one. Continue Pantoprazole 40mg  po qd- bid for now. Healthy diet, weight loss, avoid nsaids for now. Do think he should have EGD for luminal evaluation, however I think we can do this as an outpatient due to recent anticoagulants,  will discuss further with Dr Seth Parker  Thank you very much for this consult. These services were provided by Seth November, NP-C, in collaboration with Lollie Sails, MD, with whom I have discussed this patient in full.   Seth November,  NP-C  Addendum: did discuss with Dr Seth Parker: will plan to see him in clinic in the next 7-10d and arrange EGD for luminal eval

## 2015-03-14 NOTE — Discharge Summary (Signed)
Seth Parker, 29 y.o., DOB 03/28/1986, MRN [id number]. Admission date: 03/04/2015 Discharge Date 03/14/2015 Primary MD Ashok Cordia, PA-C Admitting Physician Vaughan Basta, MD  Admission Diagnosis  Ischemic chest pain [I20.9]  Discharge Diagnosis   Principal Problem:    Chest pain felt to be noncardiac and due to GI cause Hypertension Morbid obesity Tobacco use       Seth Parker is a 29 y.o. male with a known history of hypertension, morbid obesity, active use of tobacco by chewing- he had complain of on and off chest pain 5 months ago and he was seen by Dr. Solmon Ice in the office treadmill stress test and echocardiogram was done which were negative so he was advised to do lifestyle modification and risk reduction. Patient presented to the emergency room complaining of substernal discomfort with feeling of food being stuck in his esophagus. He was admitted for observation and he was seen by cardiology and they did not feel that this was cardiac Felt That This Was GI Related. Patient Was Started on PPIs and Was Discharged Home with Outpatient GI Follow-Up.           Consults  cardiology  Significant Tests:  See full reports for all details    No results found.     Today   Subjective:   Seth Parker  complains of substernal discomfort like food getting stuck but no left-sided chest pain  Objective:   Blood pressure 117/81, pulse 67, temperature 97.6 F (36.4 C), temperature source Oral, resp. rate 18, height 5\' 10"  (1.778 m), weight 133.085 kg (293 lb 6.4 oz), SpO2 99 %.  . No intake or output data in the 24 hours ending 03/14/15 1733  Exam VITAL SIGNS: Blood pressure 117/81, pulse 67, temperature 97.6 F (36.4 C), temperature source Oral, resp. rate 18, height 5\' 10"  (1.778 m), weight 133.085 kg (293 lb 6.4 oz), SpO2 99 %.  GENERAL:  29 y.o.-year-old patient lying in the bed with no acute distress.  EYES: Pupils equal, round, reactive to  light and accommodation. No scleral icterus. Extraocular muscles intact.  HEENT: Head atraumatic, normocephalic. Oropharynx and nasopharynx clear.  NECK:  Supple, no jugular venous distention. No thyroid enlargement, no tenderness.  LUNGS: Normal breath sounds bilaterally, no wheezing, rales,rhonchi or crepitation. No use of accessory muscles of respiration.  CARDIOVASCULAR: S1, S2 normal. No murmurs, rubs, or gallops.  ABDOMEN: Soft, nontender, nondistended. Bowel sounds present. No organomegaly or mass.  EXTREMITIES: No pedal edema, cyanosis, or clubbing.  NEUROLOGIC: Cranial nerves II through XII are intact. Muscle strength 5/5 in all extremities. Sensation intact. Gait not checked.  PSYCHIATRIC: The patient is alert and oriented x 3.  SKIN: No obvious rash, lesion, or ulcer.   Data Review     CBC w Diff:  Lab Results  Component Value Date   WBC 7.6 03/05/2015   WBC 11.3* 04/17/2014   HGB 14.1 03/05/2015   HGB 14.0 04/17/2014   HCT 42.1 03/05/2015   HCT 43.3 04/17/2014   PLT 204 03/05/2015   PLT 199 04/17/2014   LYMPHOPCT 8 12/29/2014   LYMPHOPCT 8.4 04/17/2014   MONOPCT 7 12/29/2014   MONOPCT 7.5 04/17/2014   EOSPCT 1 12/29/2014   EOSPCT 0.7 04/17/2014   BASOPCT 0 12/29/2014   BASOPCT 0.4 04/17/2014   CMP:  Lab Results  Component Value Date   NA 141 03/05/2015   NA 139 04/17/2014   K 3.4* 03/05/2015   K 3.7 04/17/2014  CL 106 03/05/2015   CL 105 04/17/2014   CO2 30 03/05/2015   CO2 27 04/17/2014   BUN 15 03/05/2015   BUN 13 04/17/2014   CREATININE 1.05 03/05/2015   CREATININE 1.01 04/17/2014   PROT 7.6 03/04/2015   PROT 7.6 04/17/2014   ALBUMIN 4.2 03/04/2015   ALBUMIN 3.7 04/17/2014   BILITOT 0.6 03/04/2015   BILITOT 0.8 04/17/2014   ALKPHOS 58 03/04/2015   ALKPHOS 57 04/17/2014   AST 31 03/04/2015   AST 19 04/17/2014   ALT 33 03/04/2015   ALT 36 04/17/2014  .  Micro Results No results found for this or any previous visit (from the past 240  hour(s)).         Follow-up Information    Follow up with SKULSKIE, Billie Ruddy, MD In 7 days.   Specialty:  Gastroenterology   Why:  Wednesday, September 7th at 10am, ccs   Contact information:   [place] [address] [phone]       Discharge Medications     Medication List    TAKE these medications        lisinopril-hydrochlorothiazide 10-12.5 MG per tablet  Commonly known as:  PRINZIDE,ZESTORETIC  Take 1 tablet by mouth daily.     oxyCODONE-acetaminophen 5-325 MG per tablet  Commonly known as:  ROXICET  Take 1 tablet by mouth every 6 (six) hours as needed.     pantoprazole 40 MG tablet  Commonly known as:  PROTONIX  Take 1 tablet (40 mg total) by mouth daily.     predniSONE 50 MG tablet  Commonly known as:  DELTASONE  One tablet by mouth daily for 4 days           Total Time in preparing paper work, data evaluation and todays exam - 35 minutes  Curley Flock M.D on 03/14/2015 at 5:33 PM  [facility]   Office  [phone]

## 2015-07-22 ENCOUNTER — Ambulatory Visit: Payer: Self-pay | Admitting: Physician Assistant

## 2015-07-22 ENCOUNTER — Encounter: Payer: Self-pay | Admitting: Physician Assistant

## 2015-07-22 VITALS — BP 120/90 | HR 84 | Temp 98.5°F

## 2015-07-22 DIAGNOSIS — J069 Acute upper respiratory infection, unspecified: Secondary | ICD-10-CM

## 2015-07-22 MED ORDER — CEFDINIR 300 MG PO CAPS: 300.0000 mg | ORAL_CAPSULE | Freq: Two times a day (BID) | ORAL | Status: DC

## 2015-07-22 MED ORDER — METHYLPREDNISOLONE 4 MG PO TBPK: ORAL_TABLET | ORAL | Status: DC

## 2015-07-22 MED ORDER — BENZONATATE 200 MG PO CAPS: 200.0000 mg | ORAL_CAPSULE | Freq: Two times a day (BID) | ORAL | Status: DC | PRN

## 2015-07-22 NOTE — Progress Notes (Signed)
S: C/o runny nose and congestion with dry cough for 2 weeks, started getting yellow mucus out over last 2 days, no fever, v/d, cp/sob, did have chills; cough is sporadic,   Using otc meds: robitussin  O: PE: perrl eomi, normocephalic, tms dull, nasal mucosa red and swollen, throat injected, neck supple no lymph, lungs c t a, cv rrr, neuro intact  A:  Acute viral uri   P: omnicef, medrol dose pack, tessalon, drink fluids, continue regular meds , use otc meds of choice, return if not improving in 5 days, return earlier if worsening

## 2015-09-01 ENCOUNTER — Emergency Department: Payer: Managed Care, Other (non HMO)

## 2015-09-01 ENCOUNTER — Emergency Department
Admission: EM | Admit: 2015-09-01 | Discharge: 2015-09-02 | Disposition: A | Discharge status: Home / Self Care | Payer: Managed Care, Other (non HMO) | Attending: Emergency Medicine | Admitting: Emergency Medicine

## 2015-09-01 DIAGNOSIS — R61 Generalized hyperhidrosis: Secondary | ICD-10-CM | POA: Insufficient documentation

## 2015-09-01 DIAGNOSIS — I10 Essential (primary) hypertension: Secondary | ICD-10-CM | POA: Insufficient documentation

## 2015-09-01 DIAGNOSIS — R079 Chest pain, unspecified: Secondary | ICD-10-CM

## 2015-09-01 DIAGNOSIS — M79601 Pain in right arm: Secondary | ICD-10-CM | POA: Diagnosis present

## 2015-09-01 DIAGNOSIS — M79602 Pain in left arm: Secondary | ICD-10-CM | POA: Insufficient documentation

## 2015-09-01 LAB — BASIC METABOLIC PANEL
Anion gap: 8 (ref 5–15)
BUN: 16 mg/dL (ref 6–20)
CALCIUM: 8.9 mg/dL (ref 8.9–10.3)
CO2: 25 mmol/L (ref 22–32)
CREATININE: 1.08 mg/dL (ref 0.61–1.24)
Chloride: 107 mmol/L (ref 101–111)
Glucose, Bld: 103 mg/dL — ABNORMAL HIGH (ref 65–99)
Potassium: 3.6 mmol/L (ref 3.5–5.1)
Sodium: 140 mmol/L (ref 135–145)

## 2015-09-01 LAB — CBC
HCT: 41.4 % (ref 40.0–52.0)
HEMOGLOBIN: 14 g/dL (ref 13.0–18.0)
MCH: 29 pg (ref 26.0–34.0)
MCHC: 33.9 g/dL (ref 32.0–36.0)
MCV: 85.3 fL (ref 80.0–100.0)
PLATELETS: 233 10*3/uL (ref 150–440)
RBC: 4.85 MIL/uL (ref 4.40–5.90)
RDW: 12.9 % (ref 11.5–14.5)
WBC: 8.1 10*3/uL (ref 3.8–10.6)

## 2015-09-01 LAB — TROPONIN I: TROPONIN I: 0.04 ng/mL — AB (ref <0.031)

## 2015-09-01 MED ORDER — NITROGLYCERIN 0.4 MG SL SUBL
0.4000 mg | SUBLINGUAL_TABLET | SUBLINGUAL | Status: DC | PRN
  Administered 2015-09-01: 0.4 mg via SUBLINGUAL
  Filled 2015-09-01: qty 1

## 2015-09-01 MED ORDER — ASPIRIN 81 MG PO CHEW
324.0000 mg | CHEWABLE_TABLET | Freq: Once | ORAL | Status: AC
  Administered 2015-09-01: 324 mg via ORAL
  Filled 2015-09-01: qty 4

## 2015-09-01 NOTE — ED Provider Notes (Signed)
[facility] Emergency Department Provider Note  ____________________________________________  Time seen: Approximately 11:49 PM  I have reviewed the triage vital signs and the nursing notes.   HISTORY  Chief Complaint Hypertension and Arm Pain    HPI Seth Parker is a 30 y.o. male with a history of hypertension not currently treated, obesity, strong family history of cardiac disease presenting with arm pain and hypertension. The patient reports that earlier today he had right upper extremity achiness. Over the course of the day, he developed sharp pains in the left shoulder and radiating down the left arm. This was associated with increasing blood pressures, as high as 182/104. He had associated diaphoresis but no chest pain, palpitations, lightheadedness or syncope. He has not had any calf pain, shortness of breath, or leg swelling. The pain is not associated with exertion or food, deep breaths, or position.  FH: Father with MI at age 36  SH: Denies smoking, alcohol or cocaine use  Past Medical History  Diagnosis Date  . Hypertension   . Hyperlipidemia   . Sleep apnea     a. not on CPAP  . Morbid obesity ([place])   . Tobacco abuse     a. dipping  . Family history of premature CAD     a. in father with CAD s/p CABG in early 28s    Patient Active Problem List   Diagnosis Date Noted  . Unstable angina ([place]) 03/04/2015  . Pain in the chest 08/18/2014  . SOB (shortness of breath) 08/18/2014  . Essential hypertension 08/18/2014    No past surgical history on file.  Current Outpatient Rx  Name  Route  Sig  Dispense  Refill  . benzonatate (TESSALON) 200 MG capsule   Oral   Take 1 capsule (200 mg total) by mouth 2 (two) times daily as needed for cough. Patient not taking: Reported on 09/01/2015   30 capsule   0   . cefdinir (OMNICEF) 300 MG capsule   Oral   Take 1 capsule (300 mg total) by mouth 2 (two) times daily. Patient not taking: Reported  on 09/01/2015   20 capsule   0   . methylPREDNISolone (MEDROL DOSEPAK) 4 MG TBPK tablet      Take 6 pills on day one then decrease by 1 pill each day Patient not taking: Reported on 09/01/2015   21 tablet   0     Allergies Review of patient's allergies indicates no known allergies.  Family History  Problem Relation Age of Onset  . Heart disease Father   . Heart attack Father 77    Social History Social History  Substance Use Topics  . Smoking status: Never Smoker   . Smokeless tobacco: Current User    Types: Chew  . Alcohol Use: Yes    Review of Systems Constitutional: No fever/chills. No lightheadedness or syncope. Eyes: No visual changes. ENT: No sore throat. Cardiovascular: Denies chest pain, palpitations. Respiratory: Denies shortness of breath.  No cough. Gastrointestinal: No abdominal pain.  No nausea, no vomiting.  No diarrhea.  No constipation. Genitourinary: Negative for dysuria. Musculoskeletal: Negative for back pain. Positive right upper and left upper extremity pain. Skin: Negative for rash. Neurological: Negative for headaches, focal weakness or numbness.  10-point ROS otherwise negative.  ____________________________________________   PHYSICAL EXAM:  VITAL SIGNS: ED Triage Vitals  Enc Vitals Group     BP 09/01/15 2306 131/95 mmHg     Pulse Rate 09/01/15 2306 82  Resp 09/01/15 2306 18     Temp 09/01/15 2306 98.4 F (36.9 C)     Temp Source 09/01/15 2306 Oral     SpO2 09/01/15 2306 98 %     Weight 09/01/15 2306 290 lb (131.543 kg)     Height 09/01/15 2306 5\' 10"  (1.778 m)     Head Cir --      Peak Flow --      Pain Score 09/01/15 2301 0     Pain Loc --      Pain Edu? --      Excl. in [place]? --     Constitutional: Alert and oriented and answering questions appropriately. Mildly diaphoretic.  Eyes: Conjunctivae are normal.  EOMI. Head: Atraumatic. Nose: No congestion/rhinnorhea. Mouth/Throat: Mucous membranes are moist.  Neck: No  stridor.  Supple.  No JVD Cardiovascular: Normal rate, regular rhythm. No murmurs, rubs or gallops.  Respiratory: Normal respiratory effort.  No retractions. Lungs CTAB.  No wheezes, rales or ronchi. Gastrointestinal: Obese. Soft and nontender. No distention. No peritoneal signs. Musculoskeletal: No LE edema. No calf tenderness to palpation or palpable cords. Negative Homans sign. Neurologic:  Normal speech and language. No gross focal neurologic deficits are appreciated.  Skin:  Skin is warm, dry and intact. No rash noted. Psychiatric: Mood and affect are normal. Speech and behavior are normal.  Normal judgement.  ____________________________________________   LABS (all labs ordered are listed, but only abnormal results are displayed)  Labs Reviewed  CBC  BASIC METABOLIC PANEL  TROPONIN I   ____________________________________________  EKG  ED ECG REPORT I, Eula Listen, the attending physician, personally viewed and interpreted this ECG.   Date: 09/02/2015  EKG Time: 2253  Rate: 78  Rhythm: normal sinus rhythm  Axis: Normal  Intervals:none  ST&T Change: No ST elevation. Patient does have Q waves in 2 and 3 aVF V4 V5 and V6.  This EKG is compared to 02/2015, and is similar to the previous.  ____________________________________________  RADIOLOGY  Dg Chest 2 View  09/01/2015  CLINICAL DATA:  Acute onset of left shoulder and arm pain. Left-sided chest pain. Initial encounter. EXAM: CHEST  2 VIEW COMPARISON:  Chest radiograph performed 08/12/2014 FINDINGS: The lungs are well-aerated and clear. There is no evidence of focal opacification, pleural effusion or pneumothorax. The heart is normal in size; the mediastinal contour is within normal limits. No acute osseous abnormalities are seen. IMPRESSION: No acute cardiopulmonary process seen. Electronically Signed   By: Garald Balding M.D.   On: 09/01/2015 23:31     ____________________________________________   PROCEDURES  Procedure(s) performed: None  Critical Care performed: No ____________________________________________   INITIAL IMPRESSION / ASSESSMENT AND PLAN / ED COURSE  Pertinent labs & imaging results that were available during my care of the patient were reviewed by me and considered in my medical decision making (see chart for details).  30 y.o. male with a history of untreated hypertension and obesity, positive family history, presenting with right and left upper extremity pain associated with diaphoresis. He has also had progressively increasing blood pressures today. The patient does have some cardiac risk factors, although given his age, the likelihood of ACS or MI is low and less likely. However, given his symptoms we will get an EKG to look for ischemia, and labs including troponin. 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 Consider early myalgias if he is developing an infectious etiology, however he has no symptoms at this time.  If the patient's workup in the emergency department is reassuring, he has a cardiologist whom he has seen for atypical chest pain in the past. He can follow-up with them.  I signed the patient out to Dr. Beather Arbour, who will follow up the results of his testing, and make a final disposition based on his results and clinical progress.  ____________________________________________  FINAL CLINICAL IMPRESSION(S) / ED DIAGNOSES  Final diagnoses:  None      NEW MEDICATIONS STARTED DURING THIS VISIT:  New Prescriptions   No medications on file     Eula Listen, MD 09/02/15 0006

## 2015-09-01 NOTE — ED Notes (Signed)
Pt presents with left arm pain; onset around 800 this morning. This evening pt was working with [employer] and reports having a sudden onset of nausea. Pt vomited 1X, c/o left sided chest pain with left arm pain and was said to be pale and diaphoretic at that time.  reports 184/104 manual pressure. Pt alert and able to answer questions without difficulty. states he does not currently have pain.

## 2015-09-02 ENCOUNTER — Telehealth: Payer: Self-pay | Admitting: Cardiovascular Disease

## 2015-09-02 LAB — TROPONIN I: Troponin I: 0.03 ng/mL (ref <0.031)

## 2015-09-02 MED ORDER — ACETAMINOPHEN 500 MG PO TABS
1000.0000 mg | ORAL_TABLET | Freq: Once | ORAL | Status: AC
  Administered 2015-09-02: 1000 mg via ORAL
  Filled 2015-09-02: qty 2

## 2015-09-02 NOTE — ED Provider Notes (Signed)
-----------------------------------------   11:45 PM on 09/02/2015 -----------------------------------------  Care assumed from Dr. Mariea Clonts. Patient has a minimally elevated troponin level of 0.04. In the setting of our institutions very sensitive troponins, will obtain timed repeat troponin 3 hours from initial draw. Patient currently sleeping in no acute distress.  ----------------------------------------- 2:47 AM on 09/02/2015 -----------------------------------------  Updated patient of negative repeat troponin. No complaints of chest pain currently. He will follow-up with his cardiologist closely. Strict return precautions given. Patient verbalizes understanding and agrees with plan of care.  Paulette Blanch, MD 09/02/15 [phone]

## 2015-09-02 NOTE — Discharge Instructions (Signed)
1. Please call Dr. Tyrell Antonio office this morning to schedule an appointment within the next 2 days. 2. Return to the ER for worsening symptoms, persistent vomiting, difficulty breathing or other concerns.  Nonspecific Chest Pain  Chest pain can be caused by many different conditions. There is always a chance that your pain could be related to something serious, such as a heart attack or a blood clot in your lungs. Chest pain can also be caused by conditions that are not life-threatening. If you have chest pain, it is very important to follow up with your health care provider. CAUSES  Chest pain can be caused by:  Heartburn.  Pneumonia or bronchitis.  Anxiety or stress.  Inflammation around your heart (pericarditis) or lung (pleuritis or pleurisy).  A blood clot in your lung.  A collapsed lung (pneumothorax). It can develop suddenly on its own (spontaneous pneumothorax) or from trauma to the chest.  Shingles infection (varicella-zoster virus).  Heart attack.  Damage to the bones, muscles, and cartilage that make up your chest wall. This can include:  Bruised bones due to injury.  Strained muscles or cartilage due to frequent or repeated coughing or overwork.  Fracture to one or more ribs.  Sore cartilage due to inflammation (costochondritis). RISK FACTORS  Risk factors for chest pain may include:  Activities that increase your risk for trauma or injury to your chest.  Respiratory infections or conditions that cause frequent coughing.  Medical conditions or overeating that can cause heartburn.  Heart disease or family history of heart disease.  Conditions or health behaviors that increase your risk of developing a blood clot.  Having had chicken pox (varicella zoster). SIGNS AND SYMPTOMS Chest pain can feel like:  Burning or tingling on the surface of your chest or deep in your chest.  Crushing, pressure, aching, or squeezing pain.  Dull or sharp pain that is worse  when you move, cough, or take a deep breath.  Pain that is also felt in your back, neck, shoulder, or arm, or pain that spreads to any of these areas. Your chest pain may come and go, or it may stay constant. DIAGNOSIS Lab tests or other studies may be needed to find the cause of your pain. Your health care provider may have you take a test called an ambulatory ECG (electrocardiogram). An ECG records your heartbeat patterns at the time the test is performed. You may also have other tests, such as:  Transthoracic echocardiogram (TTE). During echocardiography, sound waves are used to create a picture of all of the heart structures and to look at how blood flows through your heart.  Transesophageal echocardiogram (TEE).This is a more advanced imaging test that obtains images from inside your body. It allows your health care provider to see your heart in finer detail.  Cardiac monitoring. This allows your health care provider to monitor your heart rate and rhythm in real time.  Holter monitor. This is a portable device that records your heartbeat and can help to diagnose abnormal heartbeats. It allows your health care provider to track your heart activity for several days, if needed.  Stress tests. These can be done through exercise or by taking medicine that makes your heart beat more quickly.  Blood tests.  Imaging tests. TREATMENT  Your treatment depends on what is causing your chest pain. Treatment may include:  Medicines. These may include:  Acid blockers for heartburn.  Anti-inflammatory medicine.  Pain medicine for inflammatory conditions.  Antibiotic medicine, if an infection  is present.  Medicines to dissolve blood clots.  Medicines to treat coronary artery disease.  Supportive care for conditions that do not require medicines. This may include:  Resting.  Applying heat or cold packs to injured areas.  Limiting activities until pain decreases. HOME CARE  INSTRUCTIONS  If you were prescribed an antibiotic medicine, finish it all even if you start to feel better.  Avoid any activities that bring on chest pain.  Do not use any tobacco products, including cigarettes, chewing tobacco, or electronic cigarettes. If you need help quitting, ask your health care provider.  Do not drink alcohol.  Take medicines only as directed by your health care provider.  Keep all follow-up visits as directed by your health care provider. This is important. This includes any further testing if your chest pain does not go away.  If heartburn is the cause for your chest pain, you may be told to keep your head raised (elevated) while sleeping. This reduces the chance that acid will go from your stomach into your esophagus.  Make lifestyle changes as directed by your health care provider. These may include:  Getting regular exercise. Ask your health care provider to suggest some activities that are safe for you.  Eating a heart-healthy diet. A registered dietitian can help you to learn healthy eating options.  Maintaining a healthy weight.  Managing diabetes, if necessary.  Reducing stress. SEEK MEDICAL CARE IF:  Your chest pain does not go away after treatment.  You have a rash with blisters on your chest.  You have a fever. SEEK IMMEDIATE MEDICAL CARE IF:   Your chest pain is worse.  You have an increasing cough, or you cough up blood.  You have severe abdominal pain.  You have severe weakness.  You faint.  You have chills.  You have sudden, unexplained chest discomfort.  You have sudden, unexplained discomfort in your arms, back, neck, or jaw.  You have shortness of breath at any time.  You suddenly start to sweat, or your skin gets clammy.  You feel nauseous or you vomit.  You suddenly feel light-headed or dizzy.  Your heart begins to beat quickly, or it feels like it is skipping beats. These symptoms may represent a serious  problem that is an emergency. Do not wait to see if the symptoms will go away. Get medical help right away. Call your local emergency services (911 in the [place] Do not drive yourself to the hospital.   This information is not intended to replace advice given to you by your health care provider. Make sure you discuss any questions you have with your health care provider.   Document Released: 03/30/2005 Document Revised: 07/11/2014 Document Reviewed: 01/24/2014 Elsevier Interactive Patient Education [employer].

## 2015-09-02 NOTE — ED Notes (Signed)
Abnormal Result -  Troponin I: 0.04 Reported to Dr. Beather Arbour at this time.

## 2015-09-02 NOTE — Telephone Encounter (Signed)
lmov to make ED fu per 09/01/15

## 2015-09-04 ENCOUNTER — Ambulatory Visit: Payer: Self-pay | Admitting: Physician Assistant

## 2015-09-04 ENCOUNTER — Telehealth: Payer: Self-pay | Admitting: Emergency Medicine

## 2015-09-04 NOTE — Telephone Encounter (Signed)
Patient called and expressed that he recently started having bilateral shoulder pain.  He just wanted to know if we can refer him to an orthopedist instead of coming into the clinic.  I called [facility] and they scheduled him an appointment for 09/10/15 at 1:30.  Patient is aware of his appointment.

## 2015-09-24 ENCOUNTER — Ambulatory Visit: Payer: Self-pay | Admitting: Physician Assistant

## 2015-09-24 ENCOUNTER — Encounter: Payer: Self-pay | Admitting: Physician Assistant

## 2015-09-24 VITALS — BP 120/80 | HR 106 | Temp 100.3°F

## 2015-09-24 DIAGNOSIS — J069 Acute upper respiratory infection, unspecified: Secondary | ICD-10-CM

## 2015-09-24 DIAGNOSIS — R509 Fever, unspecified: Secondary | ICD-10-CM

## 2015-09-24 LAB — POCT INFLUENZA A/B
INFLUENZA B, POC: NEGATIVE
Influenza A, POC: NEGATIVE

## 2015-09-24 LAB — POCT RAPID STREP A (OFFICE): Rapid Strep A Screen: NEGATIVE

## 2015-09-24 MED ORDER — OSELTAMIVIR PHOSPHATE 75 MG PO CAPS: 75.0000 mg | ORAL_CAPSULE | Freq: Two times a day (BID) | ORAL | Status: DC

## 2015-09-24 MED ORDER — AMOXICILLIN-POT CLAVULANATE 875-125 MG PO TABS: 1.0000 | ORAL_TABLET | Freq: Two times a day (BID) | ORAL | Status: AC

## 2015-09-24 NOTE — Progress Notes (Signed)
S: C/o sore throat, runny nose and congestion with dry cough for 2 days, body aches, + fever, chills, denies cp/sob, v/d; ?if he has strep, gets tonsillitis a lot but strep tests are always neg,  Using otc meds: tylenol  O: PE: vitals wnl, nad,  perrl eomi, normocephalic, tms dull, nasal mucosa red and swollen, throat injected, neck supple no lymph, lungs c t a, cv rrr, neuro intact, flu swab neg, q strep neg  A:  Acute flu like illness   P: tamiflu, augmentin, drink fluids, continue regular meds , use otc meds of choice, return if not improving in 5 days, return earlier if worsening

## 2015-11-04 ENCOUNTER — Telehealth: Payer: Self-pay | Admitting: Cardiovascular Disease

## 2015-11-04 NOTE — Telephone Encounter (Signed)
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

## 2015-11-04 NOTE — Telephone Encounter (Signed)
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 He became diaphoretic, nauseated, dizzy and lightheaded.. The students did EKG which he states showed episodes of  bigeminy and trigeminy. They were unable to print the strip as it was out of paper but he states "I looked over and read the EKG on the machine". Reports a "funny feeling going on for a long time but no one knows what it is".  VS during the episode: BP 143/88, oxygen 92% RA, HR 60s-80s. He converted back to NSR and felt normal.  Reports when he exhales, HR upper 80s but decreases to 60s when he inhales. States he feels normal at this time but can have periods of tightness, pressure in center of chest and feels to be having PVCs. Last seen by Dr. Fletcher Anon 09/2014 for atypical chest pain which occurs while under stress.   I s/w Dr. Rockey Situ who states this could be GI related/vasovagal. Advise is to continue to monitor, f/u PCP for ?GI issues, ER if sx worsen, f/u w/Dr. Fletcher Anon.  S/w pt who reports he is currently under an unusual amount of stress and has known GI issues with constipation.  He is concerned as his dad MI at 71 years old requiring bypass and "he was in a lot better shape at that time than I am now". Pt reports eating a bacon, egg and cheese on toast and 1/2 can of Pepsi 30 minutes prior to Monday episode. He does not usually eat breakfast. Reviewed Dr. Donivan Scull recommendations. He verbalized understanding and will continue to monitor, f/u PCP for possible GI issues, ER if sx worsen and has June 6 appt w/Dr. Fletcher Anon. Discussed need for healthy lifestyle. Pt agrees and will work on taking better care of himself through diet and exercise.  I have put him on the wait list.  Pt agreeable w/plan and is appreciative of the call back.

## 2015-11-06 ENCOUNTER — Telehealth: Payer: Self-pay | Admitting: Cardiovascular Disease

## 2015-11-06 NOTE — Telephone Encounter (Signed)
Called pt as Seth Parker has 1:30pm opening today. Offered pt appointment. He is appreciative but unable to come to the office today as he is working at [employer] and unable to leave. He will continue to monitor s/s. Informed pt I will call again if an earlier appointment becomes available.

## 2015-12-08 ENCOUNTER — Encounter: Payer: Self-pay | Admitting: *Deleted

## 2015-12-08 ENCOUNTER — Ambulatory Visit: Payer: Self-pay | Admitting: Cardiovascular Disease

## 2016-01-19 ENCOUNTER — Emergency Department: Payer: Worker's Compensation

## 2016-01-19 ENCOUNTER — Emergency Department
Admission: EM | Admit: 2016-01-19 | Discharge: 2016-01-19 | Disposition: A | Discharge status: Home / Self Care | Payer: Worker's Compensation | Attending: Student | Admitting: Student

## 2016-01-19 ENCOUNTER — Encounter: Payer: Self-pay | Admitting: Emergency Medicine

## 2016-01-19 DIAGNOSIS — Z79899 Other long term (current) drug therapy: Secondary | ICD-10-CM | POA: Diagnosis not present

## 2016-01-19 DIAGNOSIS — S300XXA Contusion of lower back and pelvis, initial encounter: Secondary | ICD-10-CM | POA: Diagnosis not present

## 2016-01-19 DIAGNOSIS — F17220 Nicotine dependence, chewing tobacco, uncomplicated: Secondary | ICD-10-CM | POA: Insufficient documentation

## 2016-01-19 DIAGNOSIS — W010XXA Fall on same level from slipping, tripping and stumbling without subsequent striking against object, initial encounter: Secondary | ICD-10-CM | POA: Insufficient documentation

## 2016-01-19 DIAGNOSIS — Y929 Unspecified place or not applicable: Secondary | ICD-10-CM | POA: Insufficient documentation

## 2016-01-19 DIAGNOSIS — Y999 Unspecified external cause status: Secondary | ICD-10-CM | POA: Diagnosis not present

## 2016-01-19 DIAGNOSIS — I10 Essential (primary) hypertension: Secondary | ICD-10-CM | POA: Insufficient documentation

## 2016-01-19 DIAGNOSIS — E785 Hyperlipidemia, unspecified: Secondary | ICD-10-CM | POA: Diagnosis not present

## 2016-01-19 DIAGNOSIS — Y9301 Activity, walking, marching and hiking: Secondary | ICD-10-CM | POA: Insufficient documentation

## 2016-01-19 DIAGNOSIS — M533 Sacrococcygeal disorders, not elsewhere classified: Secondary | ICD-10-CM | POA: Diagnosis present

## 2016-01-19 MED ORDER — IBUPROFEN 800 MG PO TABS
800.0000 mg | ORAL_TABLET | Freq: Once | ORAL | Status: AC
  Administered 2016-01-19: 800 mg via ORAL
  Filled 2016-01-19: qty 1

## 2016-01-19 NOTE — ED Provider Notes (Signed)
[facility] Emergency Department Provider Note ____________________________________________  Time seen: 2247  I have reviewed the triage vital signs and the nursing notes.  HISTORY  Chief Complaint  Tailbone Pain  HPI MELESIO ETUE is a 30 y.o. 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 He reports pain to the tailbone after a mechanical fall today.The patient describes walking on a wet, wooden ramp, during a medical call, when he accidentally slipped, and landed on his tailbone. He reports pain to the tailbone and buttocks region with ambulation. He denies any other injury at this time. He rates discomfort as a 8/10 in triage, noting sharp pain to the tailbone and sacrum.  Past Medical History  Diagnosis Date  . Hypertension   . Hyperlipidemia   . Sleep apnea     a. not on CPAP  . Morbid obesity ([place])   . Tobacco abuse     a. dipping  . Family history of premature CAD     a. in father with CAD s/p CABG in early 62s    Patient Active Problem List   Diagnosis Date Noted  . Unstable angina ([place]) 03/04/2015  . Pain in the chest 08/18/2014  . SOB (shortness of breath) 08/18/2014  . Essential hypertension 08/18/2014    History reviewed. No pertinent past surgical history.  Current Outpatient Rx  Name  Route  Sig  Dispense  Refill  . oseltamivir (TAMIFLU) 75 MG capsule   Oral   Take 1 capsule (75 mg total) by mouth 2 (two) times daily.   10 capsule   0    Allergies Review of patient's allergies indicates no known allergies.  Family History  Problem Relation Age of Onset  . Heart disease Father   . Heart attack Father 50    Social History Social History  Substance Use Topics  . Smoking status: Never Smoker   . Smokeless tobacco: Current User    Types: Chew  . Alcohol Use: Yes   Review of Systems  Constitutional: Negative for fever. Cardiovascular: Negative for chest pain. Respiratory: Negative for shortness  of breath. Gastrointestinal: Negative for abdominal pain, vomiting and diarrhea. Genitourinary: Negative for dysuria. Musculoskeletal: Negative for back pain.Tailbone pain as above. Neurological: Negative for headaches, focal weakness or numbness. ____________________________________________  PHYSICAL EXAM:  VITAL SIGNS: ED Triage Vitals  Enc Vitals Group     BP 01/19/16 2235 157/101 mmHg     Pulse Rate 01/19/16 2235 73     Resp 01/19/16 2235 18     Temp 01/19/16 2235 98.3 F (36.8 C)     Temp Source 01/19/16 2235 Oral     SpO2 01/19/16 2235 99 %     Weight 01/19/16 2235 290 lb (131.543 kg)     Height 01/19/16 2235 5\' 9"  (1.753 m)     Head Cir --      Peak Flow --      Pain Score 01/19/16 2233 8     Pain Loc --      Pain Edu? --      Excl. in [place]? --    Constitutional: Alert and oriented. Well appearing and in no distress. Head: Normocephalic and atraumatic. Cardiovascular: Normal rate, regular rhythm.  Respiratory: Normal respiratory effort. No wheezes/rales/rhonchi. Gastrointestinal: Soft and nontender. No distention. Musculoskeletal: Normal lumbar alignment without midline tenderness, spasm, deformity. Patient is noted to palpation over the coccyx and tailbone. Normal supine to sit transition noted. Nontender with normal range  of motion in all extremities.  Neurologic:  Normal gait without ataxia. Normal speech and language. No gross focal neurologic deficits are appreciated. Skin:  Skin is warm, dry and intact. No rash noted. ____________________________________________   RADIOLOGY  Coccyx IMPRESSION: Negative.  I, Jocie Meroney, Dannielle Karvonen, personally viewed and evaluated these images (plain radiographs) as part of my medical decision making, as well as reviewing the written report by the radiologist. ____________________________________________  PROCEDURES  IBU 800 mg PO ____________________________________________  INITIAL IMPRESSION / ASSESSMENT AND PLAN / ED  COURSE  Patient with a tailbone coccyx contusion without radiologic evidence of fracture or dislocation. He is discharged with instructions on ibuprofen and Tylenol for pain relief. He is return to work without restrictions on his regular schedule. He is dismissed from work for the remainder of her shift today for initial medical treatment. He should follow-up with [facility] for ongoing symptom management or return to the ED for acutely worsening symptoms. He is encouraged to dose over-the-counter stool softeners as well. ____________________________________________  FINAL CLINICAL IMPRESSION(S) / ED DIAGNOSES  Final diagnoses:  Coccyx contusion, initial encounter     Melvenia Needles, PA-C 01/19/16 2344  Joanne Gavel, MD 01/19/16 2357

## 2016-01-19 NOTE — ED Notes (Signed)
Pt presents to ED with c/o sacral pain. Pt reports was walking down a wooden ramp when slipped and landed on tailbone. Pt able to ambulate but reports increased pain when ambulating.
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
## 2016-03-28 ENCOUNTER — Encounter: Payer: Self-pay | Admitting: Physician Assistant

## 2016-03-28 ENCOUNTER — Ambulatory Visit: Payer: Self-pay | Admitting: Physician Assistant

## 2016-03-28 VITALS — BP 150/110 | HR 79 | Temp 98.1°F

## 2016-03-28 DIAGNOSIS — F411 Generalized anxiety disorder: Secondary | ICD-10-CM

## 2016-03-28 DIAGNOSIS — F4310 Post-traumatic stress disorder, unspecified: Secondary | ICD-10-CM

## 2016-03-28 MED ORDER — SERTRALINE HCL 50 MG PO TABS: 50.0000 mg | ORAL_TABLET | Freq: Every day | ORAL | 3 refills | Status: DC

## 2016-03-28 MED ORDER — LISINOPRIL-HYDROCHLOROTHIAZIDE 10-12.5 MG PO TABS: 1.0000 | ORAL_TABLET | Freq: Every day | ORAL | 3 refills | Status: DC
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
## 2016-07-07 ENCOUNTER — Encounter: Payer: Self-pay | Admitting: Physician Assistant

## 2016-07-07 ENCOUNTER — Ambulatory Visit: Payer: Self-pay | Admitting: Physician Assistant

## 2016-07-07 VITALS — BP 145/90 | HR 90 | Temp 98.3°F

## 2016-07-07 DIAGNOSIS — J069 Acute upper respiratory infection, unspecified: Secondary | ICD-10-CM

## 2016-07-07 MED ORDER — ALBUTEROL SULFATE HFA 108 (90 BASE) MCG/ACT IN AERS: 2.0000 | INHALATION_SPRAY | Freq: Four times a day (QID) | RESPIRATORY_TRACT | 0 refills | Status: DC | PRN

## 2016-07-07 MED ORDER — HYDROCOD POLST-CPM POLST ER 10-8 MG/5ML PO SUER: 5.0000 mL | Freq: Two times a day (BID) | ORAL | 0 refills | Status: DC | PRN

## 2016-07-07 MED ORDER — METHYLPREDNISOLONE 4 MG PO TBPK: ORAL_TABLET | ORAL | 0 refills | Status: DC

## 2016-07-07 MED ORDER — AZITHROMYCIN 250 MG PO TABS: ORAL_TABLET | ORAL | 0 refills | Status: DC

## 2016-07-07 NOTE — Progress Notes (Signed)
S: C/o runny nose and congestion for 3 days, no fever, chills, cp/sob, v/d; mucus was green this am but clear throughout the day, cough is  harsh and constant, feels like his lung is going to collapse, niece has pneumonia, has been around her alot  Using otc meds  O: PE: perrl eomi, normocephalic, tms dull, nasal mucosa red and swollen, throat injected, neck supple no lymph, lungs c t a, cv rrr, neuro intact, cough is dry and hacking  A:  Acute uri/bronchitis  P: drink fluids, continue regular meds , use otc meds of choice, return if not improving in 5 days, return earlier if worsening , zpack, medrol dose pack ,albuterol inhaler, tussionex 150 ml nr

## 2016-08-30 ENCOUNTER — Telehealth: Payer: Self-pay | Admitting: Emergency Medicine

## 2016-08-30 ENCOUNTER — Observation Stay
Admission: EM | Admit: 2016-08-30 | Discharge: 2016-08-30 | Disposition: A | Discharge status: Home / Self Care | Payer: Managed Care, Other (non HMO) | Attending: Internal Medicine | Admitting: Internal Medicine

## 2016-08-30 ENCOUNTER — Encounter: Payer: Self-pay | Admitting: Emergency Medicine

## 2016-08-30 ENCOUNTER — Emergency Department: Payer: Managed Care, Other (non HMO)

## 2016-08-30 DIAGNOSIS — G4730 Sleep apnea, unspecified: Secondary | ICD-10-CM | POA: Diagnosis not present

## 2016-08-30 DIAGNOSIS — F17220 Nicotine dependence, chewing tobacco, uncomplicated: Secondary | ICD-10-CM | POA: Insufficient documentation

## 2016-08-30 DIAGNOSIS — I10 Essential (primary) hypertension: Secondary | ICD-10-CM | POA: Diagnosis not present

## 2016-08-30 DIAGNOSIS — M48062 Spinal stenosis, lumbar region with neurogenic claudication: Secondary | ICD-10-CM | POA: Diagnosis not present

## 2016-08-30 DIAGNOSIS — M5126 Other intervertebral disc displacement, lumbar region: Principal | ICD-10-CM | POA: Insufficient documentation

## 2016-08-30 DIAGNOSIS — M545 Low back pain, unspecified: Secondary | ICD-10-CM

## 2016-08-30 DIAGNOSIS — M549 Dorsalgia, unspecified: Secondary | ICD-10-CM | POA: Diagnosis present

## 2016-08-30 LAB — BASIC METABOLIC PANEL
Anion gap: 7 (ref 5–15)
BUN: 10 mg/dL (ref 6–20)
CO2: 26 mmol/L (ref 22–32)
Calcium: 9.2 mg/dL (ref 8.9–10.3)
Chloride: 102 mmol/L (ref 101–111)
Creatinine, Ser: 0.9 mg/dL (ref 0.61–1.24)
GFR calc Af Amer: >60 mL/min (ref >60)
GFR calc non Af Amer: >60 mL/min (ref >60)
Glucose, Bld: 100 mg/dL — ABNORMAL HIGH (ref 65–99)
Potassium: 3.7 mmol/L (ref 3.5–5.1)
Sodium: 135 mmol/L (ref 135–145)

## 2016-08-30 LAB — TSH: TSH: 1.75 u[IU]/mL (ref 0.350–4.500)

## 2016-08-30 MED ORDER — MORPHINE SULFATE (PF) 2 MG/ML IV SOLN
2.0000 mg | INTRAVENOUS | Status: DC | PRN
  Administered 2016-08-30 (×3): 2 mg via INTRAVENOUS
  Filled 2016-08-30: qty 3
  Filled 2016-08-30 (×2): qty 1

## 2016-08-30 MED ORDER — DIAZEPAM 5 MG PO TABS
5.0000 mg | ORAL_TABLET | Freq: Once | ORAL | Status: AC
  Administered 2016-08-30: 5 mg via ORAL
  Filled 2016-08-30: qty 1

## 2016-08-30 MED ORDER — DEXAMETHASONE SODIUM PHOSPHATE 10 MG/ML IJ SOLN
10.0000 mg | Freq: Once | INTRAMUSCULAR | Status: AC
  Administered 2016-08-30: 10 mg via INTRAVENOUS

## 2016-08-30 MED ORDER — ACETAMINOPHEN 650 MG RE SUPP: 650.0000 mg | Freq: Four times a day (QID) | RECTAL | Status: DC | PRN

## 2016-08-30 MED ORDER — ONDANSETRON HCL 4 MG PO TABS: 4.0000 mg | ORAL_TABLET | Freq: Four times a day (QID) | ORAL | Status: DC | PRN

## 2016-08-30 MED ORDER — LISINOPRIL-HYDROCHLOROTHIAZIDE 10-12.5 MG PO TABS: 1.0000 | ORAL_TABLET | Freq: Every day | ORAL | Status: DC

## 2016-08-30 MED ORDER — SERTRALINE HCL 50 MG PO TABS
50.0000 mg | ORAL_TABLET | Freq: Every day | ORAL | Status: DC
  Administered 2016-08-30: 50 mg via ORAL
  Filled 2016-08-30: qty 1

## 2016-08-30 MED ORDER — METHYLPREDNISOLONE 4 MG PO TBPK: ORAL_TABLET | ORAL | 0 refills | Status: DC

## 2016-08-30 MED ORDER — HYDROCODONE-ACETAMINOPHEN 5-325 MG PO TABS: 1.0000 | ORAL_TABLET | Freq: Four times a day (QID) | ORAL | 0 refills | Status: DC | PRN

## 2016-08-30 MED ORDER — LISINOPRIL 10 MG PO TABS
10.0000 mg | ORAL_TABLET | Freq: Every day | ORAL | Status: DC
  Administered 2016-08-30: 10 mg via ORAL
  Filled 2016-08-30: qty 1

## 2016-08-30 MED ORDER — ONDANSETRON HCL 4 MG/2ML IJ SOLN: 4.0000 mg | Freq: Four times a day (QID) | INTRAMUSCULAR | Status: DC | PRN

## 2016-08-30 MED ORDER — HYDROCHLOROTHIAZIDE 12.5 MG PO CAPS
12.5000 mg | ORAL_CAPSULE | Freq: Every day | ORAL | Status: DC
  Administered 2016-08-30: 12.5 mg via ORAL
  Filled 2016-08-30: qty 1

## 2016-08-30 MED ORDER — DIAZEPAM 5 MG/ML IJ SOLN: 5.0000 mg | Freq: Once | INTRAMUSCULAR | Status: DC

## 2016-08-30 MED ORDER — DOCUSATE SODIUM 100 MG PO CAPS
100.0000 mg | ORAL_CAPSULE | Freq: Two times a day (BID) | ORAL | Status: DC
  Administered 2016-08-30: 100 mg via ORAL
  Filled 2016-08-30: qty 1

## 2016-08-30 MED ORDER — DEXTROSE-NACL 5-0.45 % IV SOLN
INTRAVENOUS | Status: DC
  Administered 2016-08-30: 10:00:00 via INTRAVENOUS

## 2016-08-30 MED ORDER — DEXAMETHASONE SODIUM PHOSPHATE 10 MG/ML IJ SOLN
INTRAMUSCULAR | Status: AC
  Administered 2016-08-30: 10 mg via INTRAVENOUS
  Filled 2016-08-30: qty 1

## 2016-08-30 MED ORDER — CYCLOBENZAPRINE HCL 10 MG PO TABS
10.0000 mg | ORAL_TABLET | Freq: Three times a day (TID) | ORAL | Status: DC | PRN
  Administered 2016-08-30: 10 mg via ORAL
  Filled 2016-08-30: qty 1

## 2016-08-30 MED ORDER — IPRATROPIUM-ALBUTEROL 0.5-2.5 (3) MG/3ML IN SOLN: 3.0000 mL | RESPIRATORY_TRACT | Status: DC | PRN

## 2016-08-30 MED ORDER — CYCLOBENZAPRINE HCL 10 MG PO TABS: 10.0000 mg | ORAL_TABLET | Freq: Three times a day (TID) | ORAL | 0 refills | Status: DC | PRN

## 2016-08-30 MED ORDER — ACETAMINOPHEN 325 MG PO TABS: 650.0000 mg | ORAL_TABLET | Freq: Four times a day (QID) | ORAL | Status: DC | PRN

## 2016-08-30 NOTE — Evaluation (Signed)
Physical Therapy Evaluation Patient Details Name: Seth Parker MRN: [id number] DOB: March 24, 1986 Today's Date: 08/30/2016   History of Present Illness  Pt admitted for lumbar herniated disc with hemorrhage at L2-L3 resulting in canal stenosis. Pt with complaints of back pain x 2 days and specifically with onset while playing pool resulting in falling down to ground. Pt with history of HTN, obesity, and herinated disc.  Clinical Impression  Pt is a pleasant 31 year old male who was admitted for L2-L3 lumbar herinated disc with hemorrhage. Pt reports just receiving morphine, therefore pain under control. He reports at 3.5 hr, is starts to flare up and then becomes severe. Reports it radiates to L side and down B legs. Pt demonstrates all bed mobility/transfers/ambulation at baseline level. Safe technique with stair training and ambulation. Recommend OP PT for further follow up. No acute care PT needs at this time. Will dc in house.      Follow Up Recommendations Outpatient PT    Equipment Recommendations  None recommended by PT    Recommendations for Other Services       Precautions / Restrictions Precautions Precautions: Fall Restrictions Weight Bearing Restrictions: No      Mobility  Bed Mobility Overal bed mobility: Modified Independent             General bed mobility comments: safe technique performed  Transfers Overall transfer level: Independent Equipment used: None             General transfer comment: safe technique performed without AD  Ambulation/Gait Ambulation/Gait assistance: Independent Ambulation Distance (Feet): 250 Feet Assistive device: None Gait Pattern/deviations: WFL(Within Functional Limits)     General Gait Details: safe technique performed. No pain at this time, reports just received morphine  Stairs Stairs: Yes Stairs assistance: Independent Stair Management: Two rails Number of Stairs: 4 General stair comments: up/down with safe  technique. No reports of pain with stair training  Wheelchair Mobility    Modified Rankin (Stroke Patients Only)       Balance Overall balance assessment: Independent                                           Pertinent Vitals/Pain Pain Assessment: No/denies pain    Home Living Family/patient expects to be discharged to:: Private residence Living Arrangements: Parent Available Help at Discharge: Family Type of Home: House Home Access: Stairs to enter Entrance Stairs-Rails: Can reach both Entrance Stairs-Number of Steps: 6 Home Layout: One level Home Equipment: None      Prior Function Level of Independence: Independent         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        Extremity/Trunk Assessment   Upper Extremity Assessment Upper Extremity Assessment: Overall WFL for tasks assessed    Lower Extremity Assessment Lower Extremity Assessment: Overall WFL for tasks assessed       Communication   Communication: No difficulties  Cognition Arousal/Alertness: Awake/alert Behavior During Therapy: WFL for tasks assessed/performed Overall Cognitive Status: Within Functional Limits for tasks assessed                      General Comments      Exercises     Assessment/Plan    PT Assessment All further PT needs can be met in the next venue of care  PT  Problem List Pain       PT Treatment Interventions      PT Goals (Current goals can be found in the Care Plan section)  Acute Rehab PT Goals Patient Stated Goal: to go home PT Goal Formulation: All assessment and education complete, DC therapy Time For Goal Achievement: 08/30/16 Potential to Achieve Goals: Good    Frequency     Barriers to discharge        Co-evaluation               End of Session Equipment Utilized During Treatment: Gait belt Activity Tolerance: Patient tolerated treatment well Patient left: in bed Nurse Communication:  Mobility status PT Visit Diagnosis: Pain Pain - Right/Left: Left Pain - part of body:  (back)    Functional Assessment Tool Used: AM-PAC 6 Clicks Basic Mobility Functional Limitation: Mobility: Walking and moving around Mobility: Walking and Moving Around Current Status (V2224): 0 percent impaired, limited or restricted Mobility: Walking and Moving Around Goal Status [phone]): 0 percent impaired, limited or restricted Mobility: Walking and Moving Around Discharge Status [phone]): 0 percent impaired, limited or restricted    Time: 6701-1003 PT Time Calculation (min) (ACUTE ONLY): 13 min   Charges:   PT Evaluation $PT Eval Low Complexity: 1 Procedure     PT G Codes:   PT G-Codes **NOT FOR INPATIENT CLASS** Functional Assessment Tool Used: AM-PAC 6 Clicks Basic Mobility Functional Limitation: Mobility: Walking and moving around Mobility: Walking and Moving Around Current Status (E9611): 0 percent impaired, limited or restricted Mobility: Walking and Moving Around Goal Status (E4353): 0 percent impaired, limited or restricted Mobility: Walking and Moving Around Discharge Status (P1225): 0 percent impaired, limited or restricted     Chelby Salata 08/30/2016, 3:57 PM  Greggory Stallion, PT, DPT [phone]

## 2016-08-30 NOTE — Progress Notes (Addendum)
[place], [place].   08/30/2016  Patient: Seth Parker   Date of Birth:  1985-08-25  Date of admission:  08/30/2016  Date of Discharge  08/30/2016    To Whom it May Concern:   Seth Parker may return to work on 09/15/2016.  If you have any questions or concerns, please don't hesitate to call.  Sincerely,   Hillary Bow R M.D Office : [phone]   .
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
## 2016-08-30 NOTE — Progress Notes (Signed)
Pt ready for discharge. reviewd all notes and instructions with pt. Discussed prescriptions . Family at bedside.

## 2016-08-30 NOTE — ED Notes (Signed)
Dr. Brown at the bedside for pt evaluation 

## 2016-08-30 NOTE — ED Provider Notes (Signed)
[facility] Emergency Department Provider Note   First MD Initiated Contact with Patient 08/30/16 0200     (approximate)  I have reviewed the triage vital signs and the nursing notes.   HISTORY  Chief Complaint Back Pain   HPI Seth Parker is a 31 y.o. male with history of below listed current medical conditions including multiple lumbar herniated disc presents with abrupt onset of low back pain with radiation down bilateral legs 2 days. Patient states that the pain is progressively worsened over that period of time current pain score is 9 out of 10. Patient denies any lower extremity weakness numbness. Patient denies any urinary or bowel incontinence. Patient states that this pain is different than his previous sciatica pain. Patient denies any known trauma   Past Medical History:  Diagnosis Date  . Family history of premature CAD    a. in father with CAD s/p CABG in early 41s  . Hyperlipidemia   . Hypertension   . Morbid obesity ([place])   . Sleep apnea    a. not on CPAP  . Tobacco abuse    a. dipping    Patient Active Problem List   Diagnosis Date Noted  . Lumbar herniated disc 08/30/2016  . Unstable angina ([place]) 03/04/2015  . Pain in the chest 08/18/2014  . SOB (shortness of breath) 08/18/2014  . Essential hypertension 08/18/2014    History reviewed. No pertinent surgical history.  Prior to Admission medications   Medication Sig Start Date End Date Taking? Authorizing Provider  albuterol (PROVENTIL HFA;VENTOLIN HFA) 108 (90 Base) MCG/ACT inhaler Inhale 2 puffs into the lungs every 6 (six) hours as needed for wheezing or shortness of breath. 07/07/16  Yes Versie Starks, PA-C  lisinopril-hydrochlorothiazide (PRINZIDE,ZESTORETIC) 10-12.5 MG tablet Take 1 tablet by mouth daily. 03/28/16  Yes Versie Starks, PA-C  sertraline (ZOLOFT) 50 MG tablet Take 1 tablet (50 mg total) by mouth daily. 03/28/16  Yes Versie Starks, PA-C  azithromycin (ZITHROMAX  Z-PAK) 250 MG tablet 2 pills today then 1 pill a day for 4 days Patient not taking: Reported on 08/30/2016 07/07/16   Versie Starks, PA-C  chlorpheniramine-HYDROcodone [facility] PENNKINETIC ER) 10-8 MG/5ML SUER Take 5 mLs by mouth every 12 (twelve) hours as needed for cough. Patient not taking: Reported on 08/30/2016 07/07/16   Versie Starks, PA-C  methylPREDNISolone (MEDROL DOSEPAK) 4 MG TBPK tablet Take 6 pills on day one then decrease by 1 pill each day 07/07/16   Versie Starks, PA-C    Allergies No known drug allergies  Family History  Problem Relation Age of Onset  . Heart disease Father   . Heart attack Father 16    Social History Social History  Substance Use Topics  . Smoking status: Never Smoker  . Smokeless tobacco: Current User    Types: Chew  . Alcohol use Yes    Review of Systems Constitutional: No fever/chills Eyes: No visual changes. ENT: No sore throat. Cardiovascular: Denies chest pain. Respiratory: Denies shortness of breath. Gastrointestinal: No abdominal pain.  No nausea, no vomiting.  No diarrhea.  No constipation. Genitourinary: Negative for dysuria. Musculoskeletal: Positive for back pain. Skin: Negative for rash. Neurological: Negative for headaches, focal weakness or numbness.  10-point ROS otherwise negative.  ____________________________________________   PHYSICAL EXAM:  VITAL SIGNS: ED Triage Vitals  Enc Vitals Group     BP 08/30/16 0007 140/87     Pulse Rate 08/30/16 0007 70  Resp 08/30/16 0007 20     Temp 08/30/16 0007 97.9 F (36.6 C)     Temp Source 08/30/16 0007 Oral     SpO2 08/30/16 0007 97 %     Weight 08/30/16 0006 290 lb (131.5 kg)     Height 08/30/16 0006 5\' 10"  (1.778 m)     Head Circumference --      Peak Flow --      Pain Score 08/30/16 0006 8     Pain Loc --      Pain Edu? --      Excl. in [place]? --     Constitutional: Alert and oriented. Apparent discomfort Eyes: Conjunctivae are normal. PERRL. EOMI. Head:  Atraumatic. Mouth/Throat: Mucous membranes are moist.  Oropharynx non-erythematous. Neck: No stridor.  No meningeal signs.  No cervical spine tenderness to palpation. Cardiovascular: Normal rate, regular rhythm. Good peripheral circulation. Grossly normal heart sounds. Respiratory: Normal respiratory effort.  No retractions. Lungs CTAB. Gastrointestinal: Soft and nontender. No distention.  Musculoskeletal: No lower extremity tenderness nor edema. No gross deformities of extremities. Marked pain with palpation L1-3 Neurologic:  Normal speech and language. No gross focal neurologic deficits are appreciated.  Skin:  Skin is warm, dry and intact. No rash noted. Psychiatric: Mood and affect are normal. Speech and behavior are normal.  ____________________________________________   LABS (all labs ordered are listed, but only abnormal results are displayed)  Labs Reviewed  BASIC METABOLIC PANEL     RADIOLOGY I, [place], personally viewed and evaluated these images (plain radiographs) as part of my medical decision making, as well as reviewing the written report by the radiologist.  Mr Lumbar Spine Wo Contrast  Result Date: 08/30/2016 CLINICAL DATA:  Low back pain radiating to bilateral lower extremities for 2 days, no injury. Chronic low back pain. EXAM: MRI LUMBAR SPINE WITHOUT CONTRAST TECHNIQUE: Multiplanar, multisequence MR imaging of the lumbar spine was performed. No intravenous contrast was administered. COMPARISON:  MRI of lumbar spine Nov 06, 2013 FINDINGS: SEGMENTATION: For the purposes of this report, the last well-formed intervertebral disc will be described as L5-S1. ALIGNMENT: Straightened lumbar lordosis. No malalignment. VERTEBRAE:Vertebral bodies are intact. Similar moderate L4-5 disc height loss with decreased T2 signal within the L2-3 through L5-S1 disc compatible with mild desiccation. Mild subacute to chronic discogenic endplate changes [id number] through L5-S1. No abnormal  bone marrow signal. CONUS MEDULLARIS: Conus medullaris terminates at L1-2 and demonstrates normal morphology and signal characteristics. Central displacement of cauda equina due to canal stenosis. Minimal fibro lipomatosis changes of the filum terminale with chemical shift artifact. PARASPINAL AND SOFT TISSUES: Included prevertebral and paraspinal soft tissues are normal. DISC LEVELS: T12-L1, L1-2: No disc bulge, canal stenosis nor neural foraminal narrowing. L2-3: Increasing size of 6 mm RIGHT central disc extrusion with small amount of surrounding presumed hemorrhage resulting in severe canal stenosis, AP dimension of the thecal sac is 3 mm. 14 mm contiguous predominately inferior migration. RIGHT greater than LEFT lateral recess narrowing may affect the traversing L3 nerves. No neural foraminal narrowing. L3-4: 4 mm RIGHT central disc protrusion/extrusion with near complete resolution of the large extruded component from prior imaging. Mild canal stenosis. No neural foraminal narrowing. L4-5: 5 mm broad-based central disc protrusion resulting in mild canal stenosis and partial lateral recess effacement which may affect the traversing L5 nerves. Mild RIGHT, mild to moderate LEFT neural foraminal narrowing. L5-S1: 5 mm broad-based disc bulge asymmetric to the RIGHT contacts the traversing RIGHT S1 nerve within the lateral  recess. Mild LEFT facet arthropathy without canal stenosis. Mild to moderate bilateral neural foraminal narrowing. IMPRESSION: 1. Large L2-3 disc extrusion and surrounding probable hemorrhage resulting in severe canal stenosis at L2-3. 2. Near complete resolution of L3-4 disc extrusion ; residual mild L3-4 canal stenosis. 3. Neural foraminal narrowing L4-5 and L5-S1: Mild to moderate on the LEFT at L4-5. 4. Fibro lipomatosis changes of the filum terminale without cord tethering. Electronically Signed   By: Elon Alas M.D.   On: 08/30/2016 04:04    Procedures    INITIAL IMPRESSION /  ASSESSMENT AND PLAN / ED COURSE  Pertinent labs & imaging results that were available during my care of the patient were reviewed by me and considered in my medical decision making (see chart for details).  Patient given by mouth Valium in the emergency department secondary to IV Valium short each. Given history of physical exam concern for worsening lumbar radiculopathy pathology. As such MRI of the lumbar spine was performed which revealed the above stated. Patient was discussed with Dr. Lacinda Axon neurosurgeon on call who requested that the patient be admitted for observation and that he would evaluate the patient is morning. Patient subsequently discussed with Dr. Marcille Blanco hospitalist on call who admitted the patient.      ____________________________________________  FINAL CLINICAL IMPRESSION(S) / ED DIAGNOSES  Final diagnoses:  Lumbar pain  Lumbar herniated disc     MEDICATIONS GIVEN DURING THIS VISIT:  Medications  morphine 2 MG/ML injection 2-4 mg (not administered)  diazepam (VALIUM) tablet 5 mg (5 mg Oral Given 08/30/16 0301)  dexamethasone (DECADRON) injection 10 mg (10 mg Intravenous Given 08/30/16 0500)     NEW OUTPATIENT MEDICATIONS STARTED DURING THIS VISIT:  New Prescriptions   No medications on file    Modified Medications   No medications on file    Discontinued Medications   No medications on file     Note:  This document was prepared using Dragon voice recognition software and may include unintentional dictation errors.    Gregor Hams, MD 08/30/16 [phone]

## 2016-08-30 NOTE — ED Triage Notes (Signed)
Patient ambulatory to triage with steady gait, without difficulty or distress noted; pt reports lower back pain radiating into legs x 2 days with no known injury; st hx of same

## 2016-08-30 NOTE — Telephone Encounter (Signed)
Patient called and expressed that he has a long history of back issues.  His back has locked up on him and he doesn't have any muscle relaxer.  Wants to know if you can send in a script to [facility] in [place].

## 2016-08-30 NOTE — Telephone Encounter (Signed)
Pt in hospital       

## 2016-08-30 NOTE — Consult Note (Signed)
Neurosurgery-New Consultation Evaluation 08/30/2016 MOHD. WIRTS [id number]  Identifying Statement: Seth Parker is a 31 y.o. male from [place] [id number] with back and leg pain  Physician Requesting Consultation: Rosilyn Mings, MD  History of Present Illness: Seth Parker has known disc herniations at multiple levels, worse at L2/3. His last MRI was two years ago which showed severe stenosis. He was evaluated for possible surgery at that time but given only back pain, he declined and returned to work. He is an EMT. He states two days ago, he had severe onset of pain in back. He will get occasional pain in his legs. Lying in bed, he has no symptoms but with walking he gets numbness and cramping in his thighs. He denies any changes in bowel or bladder pattern. He denies any weakness. He has taken muscle relaxer at home. His work requires a lot of lifting and bending  He states he has seen PT in the past and also had muscle massage last week. He takes medication for hypertension.   Past Medical History:  Past Medical History:  Diagnosis Date  . Family history of premature CAD    a. in father with CAD s/p CABG in early 63s  . Hyperlipidemia   . Hypertension   . Morbid obesity ([place])   . Sleep apnea    a. not on CPAP  . Tobacco abuse    a. dipping    Social History: Social History   Social History  . Marital status: Single    Spouse name: N/A  . Number of children: N/A  . Years of education: N/A   Occupational History  . Not on file.   Social History Main Topics  . Smoking status: Never Smoker  . Smokeless tobacco: Current User    Types: Chew  . Alcohol use Yes  . Drug use: No  . Sexual activity: Not on file   Other Topics Concern  . Not on file   Social History Narrative  . No narrative on file   Living arrangements (living alone, with partner): works as EMT  Family History: Family History  Problem Relation Age of Onset  . Heart disease Father   . Heart  attack Father 83    Review of Systems:  Review of Systems - General ROS: Negative Psychological ROS: Negative Ophthalmic ROS: Negative ENT ROS: Negative Hematological and Lymphatic ROS: Negative  Endocrine ROS: Negative Respiratory ROS: Negative Cardiovascular ROS: Negative Gastrointestinal ROS: Negative for incontinence, last BM yesterday Genito-Urinary ROS: Negative for incontinence or retention Musculoskeletal ROS: Positive for back pain Neurological ROS: Negative for weakness Dermatological ROS: Negative  Physical Exam: BP 136/75 (BP Location: Left Arm)   Pulse 75   Temp 98.6 F (37 C) (Oral)   Resp 18   Ht 5\' 10"  (1.778 m)   Wt 131.5 kg (290 lb)   SpO2 98%   BMI 41.61 kg/m  Body mass index is 41.61 kg/m. Body surface area is 2.55 meters squared. General appearance: Alert, cooperative, laying supine Head: Normocephalic, atraumatic Eyes: Normal, EOM intact Oropharynx: Moist without lesions Ext: No edema in LE bilaterally  Neurologic exam:  Mental status: alertness: alert,  affect: normal Speech: fluent and clear Motor:strength symmetric 5/5, normal muscle mass and tone in all extremities  Sensory: intact to light touch in all extremities Reflexes: 1+ and symmetric bilaterally for patella Gait: Not tested  Laboratory: Results for orders placed or performed during the hospital encounter of [id number]  Basic metabolic panel  Result Value  Ref Range   Sodium 135 135 - 145 mmol/L   Potassium 3.7 3.5 - 5.1 mmol/L   Chloride 102 101 - 111 mmol/L   CO2 26 22 - 32 mmol/L   Glucose, Bld 100 (H) 65 - 99 mg/dL   BUN 10 6 - 20 mg/dL   Creatinine, Ser 0.90 0.61 - 1.24 mg/dL   Calcium 9.2 8.9 - 10.3 mg/dL   GFR calc non Af Amer >60 >60 mL/min   GFR calc Af Amer >60 >60 mL/min   Anion gap 7 5 - 15   I personally reviewed labs  Imaging: MRI Lumbar Spine: There are disc herniations at multiple levels, L2/3, L3/4, L4/5, L5/S1. L2/3 results in severe stenosis and  compression of thecal sac. The compression at L4/5 is moderate. At L5/S1, there is right neuroforaminal stenosis. There is mild loss of lordosis at L2/3. There is overall normal alignment   Impression/Plan:  Seth Parker is here with worsening back pain and now symptoms in legs that is consistent with claudication. He has no weakness or sensory loss on exam and has had normal bowel/bladder function. No urgent intervention is needed. We discussed treatment with steroids, muscle relaxant, and PT. He has wanted to avoid surgery in the past which is reasonable given his age, however this is a large disc and his symptoms could get worse. We talked about signs of further compression. He would like to consider surgery also which would consist of at a minimum L2-5 decompression/fusion and likely needs L5/S1 also. We will have him evaluated by Pt. He will need to be out of work for a few weeks and then we can see him in clinic   1.  Diagnosis: Lumbar Stenosis and Neurogenic Claudication  2.  Plan - Steroid pack for 1 week - Physical therapy - Follow up in clinic for discussion of surgery.

## 2016-08-31 LAB — HEMOGLOBIN A1C
Hgb A1c MFr Bld: 5.1 % (ref 4.8–5.6)
Mean Plasma Glucose: 100 mg/dL

## 2016-08-31 LAB — HIV ANTIBODY (ROUTINE TESTING W REFLEX): HIV Screen 4th Generation wRfx: NONREACTIVE

## 2016-09-06 ENCOUNTER — Encounter: Payer: Self-pay | Admitting: Physician Assistant

## 2016-09-06 ENCOUNTER — Ambulatory Visit: Payer: Self-pay | Admitting: Physician Assistant

## 2016-09-06 VITALS — BP 130/90 | HR 115 | Temp 98.0°F

## 2016-09-06 DIAGNOSIS — G8929 Other chronic pain: Secondary | ICD-10-CM

## 2016-09-06 DIAGNOSIS — M545 Low back pain, unspecified: Secondary | ICD-10-CM

## 2016-09-06 DIAGNOSIS — D352 Benign neoplasm of pituitary gland: Secondary | ICD-10-CM | POA: Insufficient documentation

## 2016-09-06 MED ORDER — OXYCODONE-ACETAMINOPHEN 10-325 MG PO TABS: 1.0000 | ORAL_TABLET | ORAL | 0 refills | Status: DC | PRN

## 2016-09-06 MED ORDER — CYCLOBENZAPRINE HCL 10 MG PO TABS: 10.0000 mg | ORAL_TABLET | Freq: Three times a day (TID) | ORAL | 0 refills | Status: DC | PRN

## 2016-09-06 NOTE — Progress Notes (Signed)
S: released from hospital for acute back pain, surgeon was concerned about cauda equina syndrome, states the mri was worse than the one 2 years ago and is meeting with the neurosurgeon on March 16 to discuss surgical option, states he is unable to work at this time, pain is different from last time, radiates around entire back, no lossof bowels or urine, states he is a little constipated from the pain medication, is out of vicodin which didn't help very much, is running low on flexeril, in the past percocet has worked better for him  O: vitals wnl, pt standing , cannot sit, tender in lumbar region, decreased rom in all planes, able to lift great toes b/l, walks without a limp or foot drop  A: acute back pain/chronic  P: percocet 10/325 #30 nr, refill on flexeril, pt is well known to clinic and is not malingering, pt is to f/u with neurosurgeon, if needs an additional refill will return to the office, form for PT signed this morning for patient

## 2016-09-07 ENCOUNTER — Encounter: Payer: Self-pay | Admitting: Physical Therapy

## 2016-09-07 ENCOUNTER — Ambulatory Visit: Payer: Managed Care, Other (non HMO) | Attending: Physician Assistant | Admitting: Physical Therapy

## 2016-09-07 VITALS — BP 134/81 | HR 110

## 2016-09-07 DIAGNOSIS — R293 Abnormal posture: Secondary | ICD-10-CM | POA: Diagnosis present

## 2016-09-07 DIAGNOSIS — M545 Low back pain: Secondary | ICD-10-CM | POA: Insufficient documentation

## 2016-09-07 NOTE — Therapy (Signed)
[place] MAIN [facility] SERVICES [address] [place], [place], [address] Phone: [phone]   Fax:  [phone]  Physical Therapy Evaluation  Patient Details  Name: Seth Parker MRN: [id number] Date of Birth: 01/12/1986 Referring Provider: Versie Starks, PA  Encounter Date: 09/07/2016      PT End of Session - 09/07/16 1622    Visit Number 1   Number of Visits 17   Date for PT Re-Evaluation 11/02/16   Authorization Type no g codes   PT Start Time 3154   PT Stop Time 0086   PT Time Calculation (min) 46 min   Activity Tolerance No increased pain;Patient limited by pain   Behavior During Therapy [facility] for tasks assessed/performed      Past Medical History:  Diagnosis Date  . Family history of premature CAD    a. in father with CAD s/p CABG in early 44s  . Hyperlipidemia   . Hypertension   . Morbid obesity ([place])   . Sleep apnea    a. not on CPAP  . Tobacco abuse    a. dipping    Past Surgical History:  Procedure Laterality Date  . EYE SURGERY     secondary to trauma as a child    Vitals:   09/07/16 1432  BP: 134/81  Pulse: (!) 110  SpO2: 98%         Subjective Assessment - 09/07/16 1433    Subjective Low back pain   Pertinent History Pt presents with severe low back pain and cramping in BLEs.  Pt reports that sitting in a recliner is the only position he can be in to completely relieve pain once muscles relax.  Current pain 6/10.  This episode of back pain was sudden onset when leaning over the table while playing pool 2 weeks ago.  Pt had a flare up 2-3 years ago and was out of work and laying in bed for a month. Pt reports chronic decreased sensation L lateral lower leg that began with last episode ~2-3 years ago.  He went to a chiropractor with complete relief.  Pt has not tried chiropractor this episode due to financial reasons.  Every 2-3 years his back pain returns and is always intense.  Initial onset of back pain was  19-20 years ago, pt came to PT which resolved his pain completely.  Wears a CPAP at night so he has to lie on his back to sleep, places a pillow under R knee and this is the only way he is able to sleep.  Heat does not relieve pain, only thing to help him has been medication.  Pt has difficulty taking a shower, picking up items off floor, descending steps.  His dad is currently driving him due to his back pain.  Pt denies any falls.  Pt is a full time paramedic, has been off work for 1.5 weeks.  Pt reports his back, buttocks, and BLEs are crampy.  Denies shooting pain down legs. Denies bowel or bladder dysfunction. Pt very adamant about having surgery but is open to trialing PT before decision is made on surgery.  Pt reports he is taking a stool softener which he has to take because of the pain medication.  Pt has a appointment with his Neurologist on 09/16/16 to discuss possible surgery.  Recent MRI lumbar spine: disc herniations at multiple levels: L2/3, L3/4, L4/5, L5/S1. L2/3 results in severe stenosis and compression of thecal sac. The compression at L4/5  is moderate. At L5/S1, there is right neuroforaminal stenosis. There is mild loss of lordosis at L2/3. There is overall normal alignment.   Limitations Sitting;Standing;Lifting;Walking;House hold activities  Work duties   How long can you sit comfortably? a few minutes   How long can you stand comfortably? 30 seconds   How long can you walk comfortably? 2 minutes   Diagnostic tests MRI   Patient Stated Goals Decrease pain, to get back to his daily routine   Currently in Pain? Yes   Pain Score 6    Pain Location Back   Pain Orientation Lower   Pain Descriptors / Indicators Aching;Sharp   Pain Type Acute pain   Pain Onset 1 to 4 weeks ago   Pain Frequency Constant   Aggravating Factors  bending, twisting, reaching overhead, sitting, static standing   Pain Relieving Factors pain medication   Effect of Pain on Daily Activities Unable to complete  work duties or daily activities without pain   Multiple Pain Sites No            [facility] PT Assessment - 09/07/16 1439      Assessment   Medical Diagnosis Back pain   Referring Provider Versie Starks, PA   Onset Date/Surgical Date 08/28/16   Hand Dominance Right   Next MD Visit 09/16/2016 appointment with Neurosurgeon   Prior Therapy Yes with success     Precautions   Precautions Back   Precaution Comments Pt already performing log roll and encouraged to avoid twisting and bending motions until pain improves     Restrictions   Weight Bearing Restrictions No     Balance Screen   Has the patient fallen in the past 6 months No   Has the patient had a decrease in activity level because of a fear of falling?  No   Is the patient reluctant to leave their home because of a fear of falling?  No     Home Social worker Private residence   Living Arrangements Parent   Available Help at Discharge Family;Available 24 hours/day   Type of Home Mobile home   Home Access Stairs to enter   Entrance Stairs-Number of Steps 6   Entrance Stairs-Rails Left;Right   Home Layout One level   Home Equipment None     Prior Function   Level of Independence Independent with gait;Independent with transfers;Needs assistance with homemaking   Vocation Full time employment   Vocation Requirements bending, lifting, twisting, reaching   Leisure Deep sea fishing     Cognition   Overall Cognitive Status Within Functional Limits for tasks assessed       EXAMINATION   *Pt arrived 20 minutes late to appointment, thus limiting PT evaluation. In addition, pt with very guarded posture and severe back and BLE pain limiting evaluation as pt not able to tolerate most positions or mobility. Pt unable to tolerate formal strength testing due to severe pain.   Objective measures were completed and results explained to the patient:  ODI: 68%   Palpation: Increased muscular tension noted Bil  lumbar paraspinals, trigger points appreciated in L lower lumbar paraspinals with severe pain response   Sensation: Dec sensation Bil toes, L lateral and anterior lower leg, L calf   Reflexes: 2+ L achilles, 1+ Bil patellar and R achilles   Coordination: WNL BLE   Posture: very guarded with decreased lumbar lordosis   Gait Analysis: Decreased step length Bil (pt reports increased pain with larger  steps) and very guarded posture with very minimal trunk rotation and absent arm swing.  Bed mobility: Pt demonstrated proper log roll technique supine<>sit without cues and reports he is doing this at home    Trunk AROM (L, R) (all with no increase in pain):  F: 0% patient unable due to pain (trialed this this morning with sharp excruciating pain in lower back)  E: 10% +pain throughout motion in lower back and sacrum region  Rotation: 40%, 30% +pulling pain down RLE with each side  Lateral F: 60% +pulling pain down RLE, 70%   Special Tests:  Repeated extension: pain decreases in lower back  SIJ compression/distraction: pain in low back with compression  Scour: negative Bil  SLR: positive on R, negative on L      TREATMENT   Therapeutic Exercise:  Standing repeated extension 2x10 with slight improvement in symptoms   Manual Therapy:  Very gentle STM Bil paraspinals x8 minutes            PT Education - 09/07/16 1621    Education provided Yes   Education Details Role of PT, POC, Exercise technique   Person(s) Educated Patient   Methods Explanation;Demonstration   Comprehension Verbalized understanding;Returned demonstration;Need further instruction             PT Long Term Goals - 09/07/16 1630      PT LONG TERM GOAL #1   Title Pt will report a worst pain in low back as 2/10 to demonstrate improved QOL   Baseline Worst 10/10   Time 6   Period Weeks   Status New     PT LONG TERM GOAL #2   Title Pt will improve Modified ODI score by at least 6 points to  demonstrate improved functional ability   Baseline 68%   Time 6   Period Weeks   Status New     PT LONG TERM GOAL #3   Title Pt will demonstrate at trunk AROM of at least 75% in all directions to demonstrate improved mobility and decrease in muscle guarding   Baseline See Eval note   Time 8   Period Weeks   Status New     PT LONG TERM GOAL #4   Title Pt will demonstrate ability to peform all siimulated work duties to allow pt to return to work   Baseline Unable to work due to pain   Time 8   Period Weeks               Plan - 09/07/16 1634    Clinical Impression Statement Pt presents with acute flare up of chronic LBP which is so severe that pt is unable to complete formal MMT and is unable to achieve positions for all special tests to be completed.  Pt is scheduled to meet with his Neurosurgeon on 09/16/16 to make a decision on surgery but is willing to trial PT until then.  He demonstrates very limited trunk AROM and impairments with all aspects of mobility due to pain and very guarded posture.  His +SLR on the R suggests that pt's confirmed herniated discs as shown on MRI are likely playing a role in pt's back pain and BLE leg cramps.  In addition, the pt responded positively to repeated extensions in standing and this will further be progressed as appropriate and as pt is able to tolerate.  Pt tolerated very gentle STM to trigger points and identified regions of increased muscular tension in the lumbar spine with  goal of decreasing acute severe pain today.  The pt will benefit from skilled PT interventions for improved functional mobility, decreased pain, return to work, and improved QOL.   Rehab Potential Good   Clinical Impairments Affecting Rehab Potential (-) Chronicity and relapsing nature of low back pain, severity of back pain, physically demanding nature of work duties (+) pt willing to try therapy despite severe pain, has had positive response to PT in the past   PT  Frequency 2x / week   PT Duration 8 weeks   PT Treatment/Interventions ADLs/Self Care Home Management;Aquatic Therapy;Cryotherapy;Electrical Stimulation;Iontophoresis 4mg /ml Dexamethasone;Moist Heat;Ultrasound;DME Instruction;Gait training;Stair training;Functional mobility training;Therapeutic activities;Therapeutic exercise;Balance training;Neuromuscular re-education;Patient/family education;Manual techniques;Passive range of motion;Dry needling;Energy conservation;Taping   PT Next Visit Plan Formal strength testing if pt able to tolerate   PT Home Exercise Plan will initiate next session   Recommended Other Services none at this time   Consulted and Agree with Plan of Care Patient      Patient will benefit from skilled therapeutic intervention in order to improve the following deficits and impairments:  Abnormal gait, Decreased activity tolerance, Decreased balance, Decreased knowledge of precautions, Decreased mobility, Decreased range of motion, Decreased strength, Difficulty walking, Hypomobility, Increased fascial restricitons, Increased muscle spasms, Impaired perceived functional ability, Impaired flexibility, Impaired sensation, Improper body mechanics, Postural dysfunction, Pain  Visit Diagnosis: Acute bilateral low back pain, with sciatica presence unspecified  Abnormal posture     Problem List Patient Active Problem List   Diagnosis Date Noted  . 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 Lumbar herniated disc 08/30/2016  . Unstable angina ([place]) 03/04/2015  . Essential hypertension 08/18/2014  . Stenosis, spinal, lumbar 11/19/2013    Collie Siad PT, DPT 09/07/2016, 4:46 PM  [place] MAIN [facility] SERVICES [address] [place], [place], [address] Phone: [phone]   Fax:  [phone]  Name: Seth Parker MRN: [id number] Date of Birth: 06-05-1986

## 2016-09-07 NOTE — Discharge Summary (Signed)
[place] [facility] [place] NAME: Seth Parker    MR#:  [id number]  DATE OF BIRTH:  Nov 01, 1985  DATE OF ADMISSION:  08/30/2016 ADMITTING PHYSICIAN: Harrie Foreman, MD  DATE OF DISCHARGE: 08/30/2016  4:46 PM  PRIMARY CARE PHYSICIAN: Ashok Cordia, PA-C   ADMISSION DIAGNOSIS:  Lumbar pain [M54.5] Lumbar herniated disc [M51.26]  DISCHARGE DIAGNOSIS:  Active Problems:   Lumbar herniated disc   SECONDARY DIAGNOSIS:   Past Medical History:  Diagnosis Date  . Family history of premature CAD    a. in father with CAD s/p CABG in early 70s  . Hyperlipidemia   . Hypertension   . Morbid obesity ([place])   . Sleep apnea    a. not on CPAP  . Tobacco abuse    a. dipping     ADMITTING HISTORY  Chief Complaint: Back pain HPI: The patient with past medical history of herniated disks present to the emergency department complaining of severe back pain. The patient states that his back is extremely more painful today than usually. He has known history of herniated disks states that his pain today was abrupt in onset and radiated down his legs. He denies any loss of bowel or bladder control. He underwent an MRI in the emergency department which showed hemorrhage associated with lumbar disc herniation. Neurosurgery was consulted who recommended Decadron and pain management for which the hospitalist service was called for admission.  HOSPITAL COURSE:    Acute on chronic low back pain Worsening degenerative disc disease. Admitted to medical floor. Pain medications through IV and oral in the hospital. Seen by neurosurgery. He was started on Medrol Dosepak. Outpatient follow-up in 2 weeks to consider surgery if no improvement. No neurological deficits on examination during the hospital stay. Discussed Dr. Lacinda Axon of neurosurgery on day of discharge. Patient stable for discharge home with pain medications, Medrol Dosepak and physical therapy.  CONSULTS OBTAINED:   Treatment Team:  Deetta Perla, MD  DRUG ALLERGIES:  No Known Allergies  DISCHARGE MEDICATIONS:   Discharge Medication List as of 08/30/2016  4:15 PM    START taking these medications   Details  cyclobenzaprine (FLEXERIL) 10 MG tablet Take 1 tablet (10 mg total) by mouth 3 (three) times daily as needed for muscle spasms., Starting Tue 08/30/2016, Print    HYDROcodone-acetaminophen (NORCO) 5-325 MG tablet Take 1 tablet by mouth every 6 (six) hours as needed for moderate pain., Starting Tue 08/30/2016, Print      CONTINUE these medications which have CHANGED   Details  methylPREDNISolone (MEDROL DOSEPAK) 4 MG TBPK tablet Take 6 pills on day one then decrease by 1 pill each day, Print      CONTINUE these medications which have NOT CHANGED   Details  albuterol (PROVENTIL HFA;VENTOLIN HFA) 108 (90 Base) MCG/ACT inhaler Inhale 2 puffs into the lungs every 6 (six) hours as needed for wheezing or shortness of breath., Starting Thu 07/07/2016, Normal    lisinopril-hydrochlorothiazide (PRINZIDE,ZESTORETIC) 10-12.5 MG tablet Take 1 tablet by mouth daily., Starting Mon 03/28/2016, Normal    sertraline (ZOLOFT) 50 MG tablet Take 1 tablet (50 mg total) by mouth daily., Starting Mon 03/28/2016, Normal      STOP taking these medications     azithromycin (ZITHROMAX Z-PAK) 250 MG tablet      chlorpheniramine-HYDROcodone (TUSSIONEX PENNKINETIC ER) 10-8 MG/5ML SUER         Today   VITAL SIGNS:  Blood pressure 121/68, pulse 86, temperature 98.5 F (  36.9 C), temperature source Oral, resp. rate 18, height 5\' 10"  (1.778 m), weight 131.5 kg (290 lb), SpO2 92 %.  I/O:  No intake or output data in the 24 hours ending 09/07/16 1427  PHYSICAL EXAMINATION:  Physical Exam  GENERAL:  31 y.o.-year-old patient lying in the bed with no acute distress.  LUNGS: Normal breath sounds bilaterally, no wheezing, rales,rhonchi or crepitation. No use of accessory muscles of respiration.  CARDIOVASCULAR: S1, S2  normal. No murmurs, rubs, or gallops.  ABDOMEN: Soft, non-tender, non-distended. Bowel sounds present. No organomegaly or mass.  NEUROLOGIC: Moves all 4 extremities. PSYCHIATRIC: The patient is alert and oriented x 3.  SKIN: No obvious rash, lesion, or ulcer.   DATA REVIEW:   CBC No results for input(s): WBC, HGB, HCT, PLT in the last 168 hours.  Chemistries  No results for input(s): NA, K, CL, CO2, GLUCOSE, BUN, CREATININE, CALCIUM, MG, AST, ALT, ALKPHOS, BILITOT in the last 168 hours.  Invalid input(s): GFRCGP  Cardiac Enzymes No results for input(s): TROPONINI in the last 168 hours.  Microbiology Results  No results found for this or any previous visit.  RADIOLOGY:  No results found.  Follow up with PCP in 1 week.  Management plans discussed with the patient, family and they are in agreement.  CODE STATUS:  Code Status History    Date Active Date Inactive Code Status Order ID Comments User Context   08/30/2016  7:54 AM 08/30/2016  7:47 PM Full Code [id number]  Harrie Foreman, MD Inpatient   03/04/2015 10:05 PM 03/05/2015  8:53 PM Full Code [id number]  Vaughan Basta, MD Inpatient      TOTAL TIME TAKING CARE OF THIS PATIENT ON DAY OF DISCHARGE: more than 30 minutes.   Hillary Bow R M.D on 09/07/2016 at 2:27 PM  Between 7am to 6pm - Pager - [phone]  After 6pm go to [email or web address] - password [email or web address] [facility]  Office  [phone]  CC: Primary care physician; Ashok Cordia, PA-C  Note: This dictation was prepared with Dragon dictation along with smaller phrase technology. Any transcriptional errors that result from this process are unintentional.

## 2016-09-09 MED ORDER — CYCLOBENZAPRINE HCL 10 MG PO TABS: 10.0000 mg | ORAL_TABLET | Freq: Three times a day (TID) | ORAL | 0 refills | Status: DC | PRN

## 2016-09-09 NOTE — Addendum Note (Signed)
Addended by: Versie Starks on: 09/09/2016 02:47 PM   Modules accepted: Orders

## 2016-09-12 ENCOUNTER — Ambulatory Visit: Payer: Managed Care, Other (non HMO) | Admitting: Physical Therapy

## 2016-09-12 NOTE — Addendum Note (Signed)
Addended by: Collie Siad D on: 09/12/2016 10:52 AM   Modules accepted: Orders

## 2016-09-13 ENCOUNTER — Ambulatory Visit: Payer: Self-pay | Admitting: Physician Assistant

## 2016-09-13 VITALS — BP 125/99 | HR 97 | Temp 98.7°F

## 2016-09-13 DIAGNOSIS — G8929 Other chronic pain: Secondary | ICD-10-CM

## 2016-09-13 DIAGNOSIS — M549 Dorsalgia, unspecified: Principal | ICD-10-CM

## 2016-09-13 MED ORDER — OXYCODONE-ACETAMINOPHEN 10-325 MG PO TABS: 1.0000 | ORAL_TABLET | ORAL | 0 refills | Status: DC | PRN

## 2016-09-13 NOTE — Progress Notes (Addendum)
S: here for med refill, has appointment with the neurosurgeon this Friday to hopefully schedule surgery for his back, percocet helped much more than the vicodin, states he has about 15 flexeril pills left, he tried to stand and cook the other day, was up for about an hour and his entire back locked up due to the pain, no loss of bowels or urine, remainder ros neg  O: vitals wnl, lungs c t a, cv rrr, pt walks slowly, tender in lower back, unable to sit due to pain, n/v intact  A: acute/chronic back pain  P: percocet 5/325 #30 nr, pt to see neurosurgeon this week  Pt called and is scheduled for surgery on April1.  Is running out of pain meds and muscle relaxer. ? If could get a refill, checked [place] prescribing site,  No other providers have written pain meds, rx for percocet 10/325 #30 nr, flexeril

## 2016-09-14 ENCOUNTER — Ambulatory Visit: Payer: Managed Care, Other (non HMO) | Admitting: Physical Therapy

## 2016-09-19 MED ORDER — OXYCODONE-ACETAMINOPHEN 10-325 MG PO TABS: 1.0000 | ORAL_TABLET | ORAL | 0 refills | Status: DC | PRN

## 2016-09-19 MED ORDER — CYCLOBENZAPRINE HCL 10 MG PO TABS: 10.0000 mg | ORAL_TABLET | Freq: Three times a day (TID) | ORAL | 0 refills | Status: DC | PRN

## 2016-09-19 NOTE — Addendum Note (Signed)
Addended by: Versie Starks on: 09/19/2016 02:50 PM   Modules accepted: Orders

## 2016-09-20 ENCOUNTER — Encounter: Payer: Self-pay | Admitting: Physical Therapy

## 2016-09-20 ENCOUNTER — Ambulatory Visit: Payer: Managed Care, Other (non HMO) | Admitting: Physical Therapy

## 2016-09-20 DIAGNOSIS — R293 Abnormal posture: Secondary | ICD-10-CM

## 2016-09-20 DIAGNOSIS — M545 Low back pain: Secondary | ICD-10-CM | POA: Diagnosis not present

## 2016-09-20 NOTE — Patient Instructions (Addendum)
Achilles Tendon Stretch   Stand with hands supported on wall, elbows slightly bent, feet parallel and both heels on floor, front knee bent, back knee straight. Slowly relax back knee until a stretch is felt in achilles tendon. Hold _20___ seconds. Repeat with leg positions switched.  Copyright  VHI. All rights reserved.  Achilles / Soleus, Standing   Stand, right foot behind, heel on floor and turned slightly out. Lower hips and bend knees. Hold _20__ seconds. Repeat _3__ times per session. Do _2__ sessions per day.  Copyright  VHI. All rights reserved.  ANKLE: Dorsiflexion, Step Unilateral   Stand on step, hang one heel off back of step. Hold _20__ seconds. __3_ reps per set, __2_ sets per day, _5_ days per week Hold onto a support.  Copyright  VHI. All rights reserved.  Hamstring Stretch (Standing)    Standing, place one heel on chair or bench. Stand up tall with toes pulled towards you, then lean chest forward until stretch is felt, you may feel a stretch with just standing up tall. Hold __15__ seconds. Repeat with other leg.  Copyright  VHI. All rights reserved.

## 2016-09-21 NOTE — Therapy (Signed)
[place] MAIN [facility] SERVICES [address] [place], [place], [address] Phone: [phone]   Fax:  [phone]  Physical Therapy Treatment  Patient Details  Name: Seth Parker MRN: [id number] Date of Birth: 1986/01/05 Referring Provider: Versie Starks, PA  Encounter Date: 09/20/2016      PT End of Session - 09/21/16 0851    Visit Number 2   Number of Visits 17   Date for PT Re-Evaluation 11/02/16   Authorization Type no g codes   PT Start Time Oct 24, 1600   PT Stop Time 0258   PT Time Calculation (min) 43 min   Activity Tolerance No increased pain   Behavior During Therapy [facility] for tasks assessed/performed      Past Medical History:  Diagnosis Date  . Family history of premature CAD    a. in father with CAD s/p CABG in early 73s  . Hyperlipidemia   . Hypertension   . Morbid obesity ([place])   . Sleep apnea    a. not on CPAP  . Tobacco abuse    a. dipping    Past Surgical History:  Procedure Laterality Date  . EYE SURGERY     secondary to trauma as a child    There were no vitals filed for this visit.      Subjective Assessment - 09/20/16 1608    Subjective Patient reports that he went to see neurosurgeon. He states that he is supposed to have a lumbar fusion in April 2018;    Pertinent History Pt presents with severe low back pain and cramping in BLEs.  Pt reports that sitting in a recliner is the only position he can be in to completely relieve pain once muscles relax.  Current pain 6/10.  This episode of back pain was sudden onset when leaning over the table while playing pool 2 weeks ago.  Pt had a flare up 2-3 years ago and was out of work and laying in bed for a month. Pt reports chronic decreased sensation L lateral lower leg that began with last episode ~2-3 years ago.  He went to a chiropractor with complete relief.  Pt has not tried chiropractor this episode due to financial reasons.  Every 2-3 years his back pain returns and  is always intense.  Initial onset of back pain was 19-20 years ago, pt came to PT which resolved his pain completely.  Wears a CPAP at night so he has to lie on his back to sleep, places a pillow under R knee and this is the only way he is able to sleep.  Heat does not relieve pain, only thing to help him has been medication.  Pt has difficulty taking a shower, picking up items off floor, descending steps.  His dad is currently driving him due to his back pain.  Pt denies any falls.  Pt is a full time paramedic, has been off work for 1.5 weeks.  Pt reports his back, buttocks, and BLEs are crampy.  Denies shooting pain down legs. Denies bowel or bladder dysfunction. Pt very adamant about having surgery but is open to trialing PT before decision is made on surgery.  Pt reports he is taking a stool softener which he has to take because of the pain medication.  Pt has a appointment with his Neurologist on 09/16/16 to discuss possible surgery.  Recent MRI lumbar spine: disc herniations at multiple levels: L2/3, L3/4, L4/5, L5/S1. L2/3 results in severe stenosis and compression  of thecal sac. The compression at L4/5 is moderate. At L5/S1, there is right neuroforaminal stenosis. There is mild loss of lordosis at L2/3. There is overall normal alignment.   Limitations Sitting;Standing;Lifting;Walking;House hold activities  Work duties   How long can you sit comfortably? a few minutes   How long can you stand comfortably? 30 seconds   How long can you walk comfortably? 2 minutes   Diagnostic tests MRI   Patient Stated Goals Decrease pain, to get back to his daily routine   Currently in Pain? Yes   Pain Score 6    Pain Location Back   Pain Orientation Lower   Pain Descriptors / Indicators Aching;Sharp   Pain Type Acute pain   Pain Onset 1 to 4 weeks ago       TREATMENT:  Educated patient in LE stretches: Standing calf stretch 15 sec hold x2 bilaterally; Soleus stretch with cues to keep heel down and to  bend knee for better soleus stretch x15 sec x2 on LLE; Patient had difficulty with keeping heel down due to stiffness;  Calf stretch with toe against wall 15 sec hold x2 bilaterally;  Standing hamstring stretch with foot on step 15 sec hold x2 bilaterally with mod Vcs to increase erect posture and to avoid slumping for better hamstring stretch; Patient had difficulty due to increased tightness; educated patient importance of stretching throughout day as patient is very active and is at risk for increased tightness;  Patient prone: PT performed extensive manual therapy to patient's left LE: Soft/deep tissue massage to left gastroc/soleus x5 min ASTYM with edge tool for tissue mobilization/extensibility x14 min to gastroc/soleus; Ischemic trigger point release 30 sec hold x3 reps to left gastroc trigger point to reduce tightness Myofascial release/cross friction massage to patient's left calf muscle x5 min;     Patient reports less tightness and less discomfort at end of session;                     PT Education - 09/21/16 0851    Education provided Yes   Education Details stretches, manual therapy;    Person(s) Educated Patient   Methods Explanation;Verbal cues;Handout   Comprehension Verbalized understanding;Returned demonstration;Verbal cues required             PT Long Term Goals - 09/07/16 1630      PT LONG TERM GOAL #1   Title Pt will report a worst pain in low back as 2/10 to demonstrate improved QOL   Baseline Worst 10/10   Time 6   Period Weeks   Status New     PT LONG TERM GOAL #2   Title Pt will improve Modified ODI score by at least 6 points to demonstrate improved functional ability   Baseline 68%   Time 6   Period Weeks   Status New     PT LONG TERM GOAL #3   Title Pt will demonstrate at trunk AROM of at least 75% in all directions to demonstrate improved mobility and decrease in muscle guarding   Baseline See Eval note   Time 8    Period Weeks   Status New     PT LONG TERM GOAL #4   Title Pt will demonstrate ability to peform all siimulated work duties to allow pt to return to work   Baseline Unable to work due to pain   Time 8   Period Weeks  Plan - 09/21/16 8280    Clinical Impression Statement Patient reports less back pain in last week or so since getting more medication. He reports going to see neurosurgeon and getting a recommendation for lumbar fusion.Patient will undergo lumbar surgery at first of April 2018. Patient does report increased tightness in left calf today; PT instructed patient in LE stretches and also performed extensive manual therapy to reduce tightness. He responded well to session with less pain at end of treatment. Patient would benefit from a few more skilled intervention sessions to educate patient in core strengthening and back strengthening to better prepare for surgery;    Rehab Potential Good   Clinical Impairments Affecting Rehab Potential (-) Chronicity and relapsing nature of low back pain, severity of back pain, physically demanding nature of work duties (+) pt willing to try therapy despite severe pain, has had positive response to PT in the past   PT Frequency 2x / week   PT Duration 8 weeks   PT Treatment/Interventions ADLs/Self Care Home Management;Aquatic Therapy;Cryotherapy;Electrical Stimulation;Iontophoresis 4mg /ml Dexamethasone;Moist Heat;Ultrasound;DME Instruction;Gait training;Stair training;Functional mobility training;Therapeutic activities;Therapeutic exercise;Balance training;Neuromuscular re-education;Patient/family education;Manual techniques;Passive range of motion;Dry needling;Energy conservation;Taping;Traction   PT Next Visit Plan Formal strength testing if pt able to tolerate   PT Home Exercise Plan initiated- next visit;    Consulted and Agree with Plan of Care Patient      Patient will benefit from skilled therapeutic intervention in order  to improve the following deficits and impairments:  Abnormal gait, Decreased activity tolerance, Decreased balance, Decreased knowledge of precautions, Decreased mobility, Decreased range of motion, Decreased strength, Difficulty walking, Hypomobility, Increased fascial restricitons, Increased muscle spasms, Impaired perceived functional ability, Impaired flexibility, Impaired sensation, Improper body mechanics, Postural dysfunction, Pain  Visit Diagnosis: Acute bilateral low back pain, with sciatica presence unspecified  Abnormal posture     Problem List Patient Active Problem List   Diagnosis Date Noted  . 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 Lumbar herniated disc 08/30/2016  . Unstable angina ([place]) 03/04/2015  . Essential hypertension 08/18/2014  . Stenosis, spinal, lumbar 11/19/2013    Maymie Brunke PT, DPT 09/21/2016, 11:49 AM  [place] MAIN [facility] SERVICES [address] [place], [place], [address] Phone: [phone]   Fax:  [phone]  Name: Seth Parker MRN: [id number] Date of Birth: 1985/12/06

## 2016-09-22 ENCOUNTER — Ambulatory Visit: Payer: Managed Care, Other (non HMO) | Admitting: Physical Therapy

## 2016-09-25 ENCOUNTER — Other Ambulatory Visit: Payer: Self-pay | Admitting: Physician Assistant

## 2016-09-26 NOTE — Telephone Encounter (Signed)
Med refill for zoloft approved.  Pt is taking the medication regularly

## 2016-09-27 ENCOUNTER — Ambulatory Visit: Payer: Managed Care, Other (non HMO) | Admitting: Physical Therapy

## 2016-09-27 ENCOUNTER — Encounter: Payer: Self-pay | Admitting: Physical Therapy

## 2016-09-27 ENCOUNTER — Ambulatory Visit: Payer: Self-pay | Admitting: Physician Assistant

## 2016-09-27 DIAGNOSIS — R293 Abnormal posture: Secondary | ICD-10-CM

## 2016-09-27 DIAGNOSIS — Z0289 Encounter for other administrative examinations: Secondary | ICD-10-CM

## 2016-09-27 DIAGNOSIS — M545 Low back pain: Secondary | ICD-10-CM

## 2016-09-27 NOTE — Therapy (Signed)
[place] MAIN [facility] SERVICES [address] [place], [place], [address] Phone: [phone]   Fax:  [phone]  Physical Therapy Treatment  Patient Details  Name: Seth Parker MRN: [id number] Date of Birth: 1986/05/09 Referring Provider: Versie Starks, PA  Encounter Date: 09/27/2016      PT End of Session - 09/27/16 1126    Visit Number 3   Number of Visits 17   Date for PT Re-Evaluation 11/02/16   Authorization Type no g codes   PT Start Time 10/27/15   PT Stop Time 1200   PT Time Calculation (min) 43 min   Activity Tolerance No increased pain   Behavior During Therapy [facility] for tasks assessed/performed      Past Medical History:  Diagnosis Date  . Family history of premature CAD    a. in father with CAD s/p CABG in early 90s  . Hyperlipidemia   . Hypertension   . Morbid obesity ([place])   . Sleep apnea    a. not on CPAP  . Tobacco abuse    a. dipping    Past Surgical History:  Procedure Laterality Date  . EYE SURGERY     secondary to trauma as a child    There were no vitals filed for this visit.      Subjective Assessment - 09/27/16 1124    Subjective Patient reports feeling a lot better today; He denies any pain currently; He reports no tightness in LE; Patient reports that he is supposed to have surgery on his back next week;    Pertinent History Pt presents with severe low back pain and cramping in BLEs.  Pt reports that sitting in a recliner is the only position he can be in to completely relieve pain once muscles relax.  Current pain 6/10.  This episode of back pain was sudden onset when leaning over the table while playing pool 2 weeks ago.  Pt had a flare up 2-3 years ago and was out of work and laying in bed for a month. Pt reports chronic decreased sensation L lateral lower leg that began with last episode ~2-3 years ago.  He went to a chiropractor with complete relief.  Pt has not tried chiropractor this episode due to  financial reasons.  Every 2-3 years his back pain returns and is always intense.  Initial onset of back pain was 19-20 years ago, pt came to PT which resolved his pain completely.  Wears a CPAP at night so he has to lie on his back to sleep, places a pillow under R knee and this is the only way he is able to sleep.  Heat does not relieve pain, only thing to help him has been medication.  Pt has difficulty taking a shower, picking up items off floor, descending steps.  His dad is currently driving him due to his back pain.  Pt denies any falls.  Pt is a full time paramedic, has been off work for 1.5 weeks.  Pt reports his back, buttocks, and BLEs are crampy.  Denies shooting pain down legs. Denies bowel or bladder dysfunction. Pt very adamant about having surgery but is open to trialing PT before decision is made on surgery.  Pt reports he is taking a stool softener which he has to take because of the pain medication.  Pt has a appointment with his Neurologist on 09/16/16 to discuss possible surgery.  Recent MRI lumbar spine: disc herniations at multiple levels: L2/3,  L3/4, L4/5, L5/S1. L2/3 results in severe stenosis and compression of thecal sac. The compression at L4/5 is moderate. At L5/S1, there is right neuroforaminal stenosis. There is mild loss of lordosis at L2/3. There is overall normal alignment.   Limitations Sitting;Standing;Lifting;Walking;House hold activities  Work duties   How long can you sit comfortably? a few minutes   How long can you stand comfortably? 30 seconds   How long can you walk comfortably? 2 minutes   Diagnostic tests MRI   Patient Stated Goals Decrease pain, to get back to his daily routine   Currently in Pain? No/denies   Pain Onset --        TREATMENT: Instructed patient in core strengthening and back stretches to better prepare for surgery:  Hooklying: Lumbar trunk rotation x2 min with cues to avoid painful ROM; Single knee to chest with towel 15 sec hold x1  bilaterally; (requires min VCs for positioning) Posterior pelvic tilt with diaphragmatic breathing 2x5 with min VCs for technique and positioning; Hamstring neural stretch 5 sec hold x5 each LE with min VCs for positioning to improve stretch; Bridges with arms by side x10 with cues to avoid painful ROM; Posterior pelvic tilt with alternate march x10 bilaterally;  Patient required min-moderate verbal/tactile cues for correct exercise technique and to increase core abdominal stabilization with UE/LE movement  Sidelying: Green tband clamshells 2x10 with cues for positioning to improve hip strengthening;   Patient also educated in log roll technique to improve ability to move in bed with less discomfort;    Standing: Pelvic rocks, anterior/posterior x10 reps, against wall and away from wall with cues for positioning and to reduce upper trunk movement but to isolate pelvic mobility;  Seated on pball: Gentle bounce x2 min; Anterior/posterior pelvic rocks x10 reps with min VCs to reduce LE movement but to isolate pelvic mobility; Side/side pelvic rocks x10 reps; Pelvic rocks clockwise x5 reps with mod VCs for positioning and to improve postural control for better pelvic mobility;  Patient denies any pain after treatment session;                        PT Education - 09/27/16 1124    Education provided Yes   Education Details core strengthening, HEP   Person(s) Educated Patient   Methods Explanation;Verbal cues;Handout   Comprehension Verbalized understanding;Returned demonstration;Verbal cues required             PT Long Term Goals - 09/07/16 1630      PT LONG TERM GOAL #1   Title Pt will report a worst pain in low back as 2/10 to demonstrate improved QOL   Baseline Worst 10/10   Time 6   Period Weeks   Status New     PT LONG TERM GOAL #2   Title Pt will improve Modified ODI score by at least 6 points to demonstrate improved functional ability   Baseline  68%   Time 6   Period Weeks   Status New     PT LONG TERM GOAL #3   Title Pt will demonstrate at trunk AROM of at least 75% in all directions to demonstrate improved mobility and decrease in muscle guarding   Baseline See Eval note   Time 8   Period Weeks   Status New     PT LONG TERM GOAL #4   Title Pt will demonstrate ability to peform all siimulated work duties to allow pt to return to work  Baseline Unable to work due to pain   Time [place] - 09/27/16 1244    Clinical Impression Statement Patient reports no back pain today; Instructed patient in core strengthening and lumbar ROM. He reports increased fatigue with advanced exercise but no increase in pain; Patient does require min VCs for correct exercise technique, particularly to improve core activation for better pelvic mobility; He denies any pain after treatment session; he would benefit from additional skilled PT intervention to improve strength and core stability for less back pain; Patient is still preparing for surgery next week;    Rehab Potential Good   Clinical Impairments Affecting Rehab Potential (-) Chronicity and relapsing nature of low back pain, severity of back pain, physically demanding nature of work duties (+) pt willing to try therapy despite severe pain, has had positive response to PT in the past   PT Frequency 2x / week   PT Duration 8 weeks   PT Treatment/Interventions ADLs/Self Care Home Management;Aquatic Therapy;Cryotherapy;Electrical Stimulation;Iontophoresis 4mg /ml Dexamethasone;Moist Heat;Ultrasound;DME Instruction;Gait training;Stair training;Functional mobility training;Therapeutic activities;Therapeutic exercise;Balance training;Neuromuscular re-education;Patient/family education;Manual techniques;Passive range of motion;Dry needling;Energy conservation;Taping;Traction   PT Next Visit Plan Formal strength testing if pt able to tolerate   PT Home Exercise Plan  initiated with core strengthening;    Consulted and Agree with Plan of Care Patient      Patient will benefit from skilled therapeutic intervention in order to improve the following deficits and impairments:  Abnormal gait, Decreased activity tolerance, Decreased balance, Decreased knowledge of precautions, Decreased mobility, Decreased range of motion, Decreased strength, Difficulty walking, Hypomobility, Increased fascial restricitons, Increased muscle spasms, Impaired perceived functional ability, Impaired flexibility, Impaired sensation, Improper body mechanics, Postural dysfunction, Pain  Visit Diagnosis: Acute bilateral low back pain, with sciatica presence unspecified  Abnormal posture     Problem List Patient Active Problem List   Diagnosis Date Noted  . 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 Lumbar herniated disc 08/30/2016  . Unstable angina ([place]) 03/04/2015  . Essential hypertension 08/18/2014  . Stenosis, spinal, lumbar 11/19/2013    Karlos Scadden PT, DPT 09/27/2016, 12:47 PM  [place] MAIN [facility] SERVICES [address] [place], [place], [address] Phone: [phone]   Fax:  [phone]  Name: ALYAS CREARY MRN: [id number] Date of Birth: 09/16/1985

## 2016-09-27 NOTE — Patient Instructions (Addendum)
  Copyright  VHI. All rights reserved. Knee to Chest (Flexion)   Pull knee toward chest. Feel stretch in lower back or buttock area. Breathing deeply, Hold __15__ seconds. Repeat with other knee. Repeat _2-3___ times. Do _2-3___ sessions per day.  [email or web address].[email or web address]   Copyright  VHI. All rights reserved.   Lower Trunk Rotation Stretch  Lying on back with knees bent, Keeping back flat and feet together, rotate knees side to side slowly and in pain free range of motion.  Hold _2___ seconds. Repeat for 1-2 minutes. Do __1__ sets per session. Do __2-3__ sessions per day.  [email or web address].[email or web address]/122   Pelvic Tilt  Lying on back with knees bent, Flatten back by tightening stomach muscles and rocking hips back Hold for 5 sec, Repeat __10__ times per set. Do __1__ sets per session. Do __2__ sessions per day.  http://[email or web address].us/134    Copyright  VHI. All rights reserved.   Bridge   Lie back, legs bent. Inhale, pressing hips up.  Exhale, rolling down along spine from top. Repeat _10___ times. Do __1-2__ sessions per day.  Copyright  VHI. All rights reserved.   Knee to Chest: Sagittal Plane Stability    Bring one knee up, then return. Be sure pelvis does not rock backward or forward. Keep core muscles tight as you lift your legs; Keep pelvis still. Lift knee 10___ times. Restabilize pelvis. Repeat with other leg. Do __1_ sets, 2___ times per day.  http://ss.[email or web address].[email or web address]   Copyright  VHI. All rights reserved.  Pelvic Anterior / Posterior Tilt / Pelvic Rock    Stand with upper back and buttocks touching wall, feet 3-5____ inches from wall, knees relaxed. A.Anterior tilt: Raise chest and arch back slightly. B.Posterior tilt: Contract abdominals and flatten back. Rock _10___ times per session. Do _every hour as tolerated; Progression: Perform pelvic tilt away from wall.  Copyright  VHI. All rights reserved.

## 2016-09-27 NOTE — Progress Notes (Signed)
Filled out fmla and disability insurance forms for pt due to recent back issues and pending surgery

## 2016-09-30 ENCOUNTER — Other Ambulatory Visit: Payer: Self-pay | Admitting: Emergency Medicine

## 2016-09-30 MED ORDER — OXYCODONE-ACETAMINOPHEN 10-325 MG PO TABS: 1.0000 | ORAL_TABLET | ORAL | 0 refills | Status: DC | PRN

## 2016-09-30 MED ORDER — CYCLOBENZAPRINE HCL 10 MG PO TABS: 10.0000 mg | ORAL_TABLET | Freq: Three times a day (TID) | ORAL | 0 refills | Status: DC | PRN

## 2016-09-30 NOTE — Telephone Encounter (Signed)
Med refill for back pain meds approved, pt to pick up rx for percocet 10/325 #30 nr; surgery is scheduled for next week

## 2016-10-03 ENCOUNTER — Encounter: Payer: Self-pay | Admitting: Physical Therapy

## 2016-10-03 ENCOUNTER — Ambulatory Visit: Payer: Managed Care, Other (non HMO) | Attending: Physician Assistant | Admitting: Physical Therapy

## 2016-10-03 DIAGNOSIS — M545 Low back pain: Secondary | ICD-10-CM | POA: Insufficient documentation

## 2016-10-03 DIAGNOSIS — R293 Abnormal posture: Secondary | ICD-10-CM | POA: Diagnosis present

## 2016-10-03 NOTE — Patient Instructions (Addendum)
Knee Flexion With One Pillow: Sagittal Plane Stability    Bend knee. Keep pelvis still ("tuck tail"). Do _10__ times. Restabilize pelvis. Repeat with other leg. Do _1__ sets, _2__ times per day.  http://ss.[email or web address].[email or web address]   Copyright  VHI. All rights reserved.  EXTENSION: Prone - Knee Extended (Active)    Lie on stomach, legs straight. Lift right leg toward ceiling. Use 0___ lbs. Complete _2__ sets of 10__ repetitions. Perform _2__ sessions per day.  http://[email or web address].[email or web address]   Copyright  VHI. All rights reserved.  Bent Knee Lift (Prone)    Abdomen and head supported, bend left knee and slowly raise hip. Avoid arching low back. Repeat _10___ times per set. Do __2__ sets per session. Do ____2 sessions per day.  http://orth.[email or web address].us/1110   Copyright  VHI. All rights reserved.  On Elbows (Prone)    Rise up on elbows as high as possible, keeping hips on floor. Hold __2__ seconds. Repeat _10___ times per set. Do ___2_ sets per session. Do ___2_ sessions per day.  http://orth.[email or web address].us/92   Copyright  VHI. All rights reserved.  Arm / Leg Lift: Opposite (Prone)    Lift right leg and opposite arm __2__ inches from floor, keeping knee locked. Repeat __10__ times per set. Do __1__ sets per session. Do __2__ sessions per day.  http://orth.exer.us/100   Copyright  VHI. All rights reserved.  Spinal Mobility (Cat / Camel): Flexion / Extension    Get ON TARGET. NO ROLLERS- just be on hands/knees on your bed,  1.Cat: Buttocks up, arch spine segmentally, bottom to top: lift chest as head moves back, look up. 2.Camel: Reverse movement. Close eyes, lower head, tuck chin, compress chest and abdomen, round back. Hold _5__ seconds. Repeat _5__ times. Do _2-3__ sessions per day.  Copyright  VHI. All rights reserved.  Child Pose    Sitting on knees, fold body over legs and relax head and arms on floor. Hold for __10__sec.  http://[email or web address].exer.[email or web address]   Copyright  VHI. All rights  reserved.

## 2016-10-03 NOTE — Therapy (Signed)
[place] [facility] MAIN [facility] SERVICES [address] [place], [place], [address] Phone: [phone]   Fax:  [phone]  Physical Therapy Treatment/Discharge summary  Patient Details  Name: Seth Parker MRN: [id number] Date of Birth: 1985-09-09 Referring Provider: Faythe Ghee, PA  Encounter Date: 10/03/2016      PT End of Session - 10/03/16 1524    Visit Number 4   Number of Visits 17   Date for PT Re-Evaluation 11/02/16   Authorization Type no g codes   PT Start Time 1518   PT Stop Time 1600   PT Time Calculation (min) 42 min   Activity Tolerance No increased pain   Behavior During Therapy [facility] for tasks assessed/performed      Past Medical History:  Diagnosis Date  . Family history of premature CAD    a. in father with CAD s/p CABG in early 16s  . Hyperlipidemia   . Hypertension   . Morbid obesity (HCC)   . Sleep apnea    a. not on CPAP  . Tobacco abuse    a. dipping    Past Surgical History:  Procedure Laterality Date  . EYE SURGERY     secondary to trauma as a child    There were no vitals filed for this visit.      Subjective Assessment - 10/03/16 1523    Subjective patient reports doing really well; He denies using any pain meds or muscle relaxers. He reports being able to play golf without pain. Reports compliance with HEP:    Pertinent History Pt presents with severe low back pain and cramping in BLEs.  Pt reports that sitting in a recliner is the only position he can be in to completely relieve pain once muscles relax.  Current pain 6/10.  This episode of back pain was sudden onset when leaning over the table while playing pool 2 weeks ago.  Pt had a flare up 2-3 years ago and was out of work and laying in bed for a month. Pt reports chronic decreased sensation L lateral lower leg that began with last episode ~2-3 years ago.  He went to a chiropractor with complete relief.  Pt has not tried chiropractor this episode due to  financial reasons.  Every 2-3 years his back pain returns and is always intense.  Initial onset of back pain was 19-20 years ago, pt came to PT which resolved his pain completely.  Wears a CPAP at night so he has to lie on his back to sleep, places a pillow under R knee and this is the only way he is able to sleep.  Heat does not relieve pain, only thing to help him has been medication.  Pt has difficulty taking a shower, picking up items off floor, descending steps.  His dad is currently driving him due to his back pain.  Pt denies any falls.  Pt is a full time paramedic, has been off work for 1.5 weeks.  Pt reports his back, buttocks, and BLEs are crampy.  Denies shooting pain down legs. Denies bowel or bladder dysfunction. Pt very adamant about having surgery but is open to trialing PT before decision is made on surgery.  Pt reports he is taking a stool softener which he has to take because of the pain medication.  Pt has a appointment with his Neurologist on 09/16/16 to discuss possible surgery.  Recent MRI lumbar spine: disc herniations at multiple levels: L2/3, L3/4, L4/5, L5/S1. L2/3  results in severe stenosis and compression of thecal sac. The compression at L4/5 is moderate. At L5/S1, there is right neuroforaminal stenosis. There is mild loss of lordosis at L2/3. There is overall normal alignment.   Limitations Sitting;Standing;Lifting;Walking;House hold activities  Work duties   How long can you sit comfortably? a few minutes   How long can you stand comfortably? 30 seconds   How long can you walk comfortably? 2 minutes   Diagnostic tests MRI   Patient Stated Goals Decrease pain, to get back to his daily routine   Currently in Pain? No/denies        TREATMENT: Instructed patient in core strengthening and back stretches to better prepare for surgery:  Hooklying: Lumbar trunk rotation x2 min with cues to avoid painful ROM; Single knee to chest with towel 15 sec hold x1 bilaterally; (requires  min VCs for positioning) Posterior pelvic tilt with diaphragmatic breathing 2x5 with min VCs for technique and positioning; Hamstring neural stretch 5 sec hold x5 each LE with min VCs for positioning to improve stretch;  Patient required min-moderate verbal/tactile cues for correct exercise technique and to increase core abdominal stabilization with UE/LE movement  Patient educated on proper prone positioning (using pillow under stomach as needed): Alternate knee flexion x10 bilaterally; Hip extension x10 bilaterally; Glute max hip extension x10 bilaterally; Alternate UE/LE lift 2x10 bilaterally; Prone press up on elbows x10 reps;  Patient required min-moderate verbal/tactile cues for correct exercise technique including to avoid excessive leg lift for less discomfort;  Qped: Cat/camel stretch 5 sec hold x5 each with min Vcs to increase lower abdominal/lumbar movement; Child's pose, 10 sec hold x2 with cues for positioning to improve stretch;    Patient denies any pain after treatment session;      [facility] PT Assessment - 10/03/16 0001      Observation/Other Assessments   Modified [place] 26% (mod disability)  was 68% at initial eval on 09/07/16                             PT Education - 10/03/16 1523    Education provided Yes   Education Details core strengthening,  back exercise, HEP reinforced;    Person(s) Educated Patient   Methods Explanation;Verbal cues;Handout   Comprehension Verbalized understanding;Returned demonstration;Verbal cues required             PT Long Term Goals - 10/03/16 1528      PT LONG TERM GOAL #1   Title Pt will report a worst pain in low back as 2/10 to demonstrate improved QOL   Baseline Worst 10/10   Time 6   Period Weeks   Status Achieved     PT LONG TERM GOAL #2   Title Pt will improve Modified ODI score by at least 6 points to demonstrate improved functional ability   Baseline 68%   Time 6   Period Weeks    Status Achieved     PT LONG TERM GOAL #3   Title Pt will demonstrate at trunk AROM of at least 75% in all directions to demonstrate improved mobility and decrease in muscle guarding   Baseline See Eval note   Time 8   Period Weeks   Status Partially Met     PT LONG TERM GOAL #4   Title Pt will demonstrate ability to peform all siimulated work duties to allow pt to return to work   Baseline Unable to work due  to pain   Time 8   Period Weeks   Status Partially Met               Plan - 10/03/16 1526    Clinical Impression Statement Patient instructed in core abdominal and back exercise to better prepare for surgery; He does require min VCs for correct exercise technique;  Despite being pain free, he is still wanting to proceed with surgery; Patient will be having a lumbar fusion done this week; He will be discharged from PT at this time and will need new orders following surgery;    Rehab Potential Good   Clinical Impairments Affecting Rehab Potential (-) Chronicity and relapsing nature of low back pain, severity of back pain, physically demanding nature of work duties (+) pt willing to try therapy despite severe pain, has had positive response to PT in the past   PT Frequency 2x / week   PT Duration 8 weeks   PT Treatment/Interventions ADLs/Self Care Home Management;Aquatic Therapy;Cryotherapy;Electrical Stimulation;Iontophoresis 53m/ml Dexamethasone;Moist Heat;Ultrasound;DME Instruction;Gait training;Stair training;Functional mobility training;Therapeutic activities;Therapeutic exercise;Balance training;Neuromuscular re-education;Patient/family education;Manual techniques;Passive range of motion;Dry needling;Energy conservation;Taping;Traction   PT Next Visit Plan Formal strength testing if pt able to tolerate   PT Home Exercise Plan continue as given;    Consulted and Agree with Plan of Care Patient      Patient will benefit from skilled therapeutic intervention in order to  improve the following deficits and impairments:  Abnormal gait, Decreased activity tolerance, Decreased balance, Decreased knowledge of precautions, Decreased mobility, Decreased range of motion, Decreased strength, Difficulty walking, Hypomobility, Increased fascial restricitons, Increased muscle spasms, Impaired perceived functional ability, Impaired flexibility, Impaired sensation, Improper body mechanics, Postural dysfunction, Pain  Visit Diagnosis: Acute bilateral low back pain, with sciatica presence unspecified  Abnormal posture     Problem List Patient Active Problem List   Diagnosis Date Noted  . 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 Lumbar herniated disc 08/30/2016  . Unstable angina ([place] 03/04/2015  . Essential hypertension 08/18/2014  . Stenosis, spinal, lumbar 11/19/2013    Trotter,Margaret PT, DPT 10/03/2016, 3:55 PM  [place] [facility] [address] [place] [id number]: [phone]  Fax:  [phone] Name: Seth MASSENBURGMRN: [id number] of Birth: [birth year]

## 2016-10-05 ENCOUNTER — Encounter: Payer: Self-pay | Admitting: Physical Therapy

## 2016-10-10 ENCOUNTER — Encounter: Payer: Self-pay | Admitting: Physical Therapy

## 2016-10-12 ENCOUNTER — Encounter: Payer: Self-pay | Admitting: Physical Therapy

## 2016-10-17 ENCOUNTER — Encounter: Payer: Self-pay | Admitting: Physical Therapy

## 2016-10-19 ENCOUNTER — Encounter: Payer: Self-pay | Admitting: Physical Therapy

## 2016-10-24 ENCOUNTER — Encounter: Payer: Self-pay | Admitting: Physical Therapy

## 2016-10-26 ENCOUNTER — Encounter: Payer: Self-pay | Admitting: Physical Therapy

## 2016-10-26 ENCOUNTER — Ambulatory Visit: Payer: Self-pay | Admitting: Physician Assistant

## 2016-10-26 ENCOUNTER — Encounter: Payer: Self-pay | Admitting: Physician Assistant

## 2016-10-26 VITALS — BP 130/89 | HR 85 | Temp 97.7°F | Ht 70.0 in | Wt 311.0 lb

## 2016-10-26 DIAGNOSIS — M4800 Spinal stenosis, site unspecified: Secondary | ICD-10-CM

## 2016-10-26 DIAGNOSIS — Z0189 Encounter for other specified special examinations: Principal | ICD-10-CM

## 2016-10-26 DIAGNOSIS — Z008 Encounter for other general examination: Secondary | ICD-10-CM

## 2016-10-26 DIAGNOSIS — Z0000 Encounter for general adult medical examination without abnormal findings: Secondary | ICD-10-CM

## 2016-10-26 NOTE — Progress Notes (Signed)
S: pt here for wellness physical and biometrics for insurance purposes, also ?if he needs long term physical therapy for his back, his surgery for the spinal fusion was denied, ? If he needs another opinion; no other complaints ros neg. PMH:    Social:  Fam:  O: vitals wnl, nad, ENT wnl, neck supple no lymph, lungs c t a, cv rrr, abd soft nontender bs normal all 4 quads  A: wellness, biometric physical  P: pt will return for fasting labs, will refer to [facility] for eval, will consider PT but would like an eval by [facility] first

## 2016-10-28 ENCOUNTER — Other Ambulatory Visit: Payer: Self-pay

## 2016-11-18 ENCOUNTER — Other Ambulatory Visit: Payer: Self-pay | Admitting: Physician Assistant

## 2016-11-18 NOTE — Telephone Encounter (Signed)
Med refill for zoloft approved, pt is doing well on medications

## 2016-12-23 ENCOUNTER — Encounter: Payer: Self-pay | Admitting: Physician Assistant

## 2016-12-23 ENCOUNTER — Ambulatory Visit: Payer: Self-pay | Admitting: Physician Assistant

## 2016-12-23 ENCOUNTER — Encounter (INDEPENDENT_AMBULATORY_CARE_PROVIDER_SITE_OTHER): Payer: Self-pay

## 2016-12-23 VITALS — BP 130/89 | HR 72 | Temp 98.5°F | Resp 16 | Ht 70.0 in | Wt 311.0 lb

## 2016-12-23 DIAGNOSIS — Z008 Encounter for other general examination: Secondary | ICD-10-CM

## 2016-12-23 DIAGNOSIS — Z0189 Encounter for other specified special examinations: Secondary | ICD-10-CM

## 2016-12-23 DIAGNOSIS — Z0000 Encounter for general adult medical examination without abnormal findings: Secondary | ICD-10-CM

## 2016-12-23 NOTE — Progress Notes (Signed)
S: pt here for wellness physical and biometrics for insurance purposes, no complaints ros neg. PMH:  Chronic back problems, htn,   Social: +dip  Fam: see chart  O: vitals wnl, nad, ENT wnl, neck supple no lymph, lungs c t a, cv rrr, abd soft nontender bs normal all 4 quads  A: wellness, biometric physical  P: exec panel, vit d, hep c

## 2016-12-24 LAB — CMP12+LP+TP+TSH+6AC+CBC/D/PLT
A/G RATIO: 1.6 (ref 1.2–2.2)
ALK PHOS: 52 IU/L (ref 39–117)
ALT: 82 IU/L — AB (ref 0–44)
AST: 46 IU/L — ABNORMAL HIGH (ref 0–40)
Albumin: 4.1 g/dL (ref 3.5–5.5)
BASOS: 0 %
BUN / CREAT RATIO: 8 — AB (ref 9–20)
BUN: 7 mg/dL (ref 6–20)
Basophils Absolute: 0 10*3/uL (ref 0.0–0.2)
Bilirubin Total: 0.5 mg/dL (ref 0.0–1.2)
CALCIUM: 9.2 mg/dL (ref 8.7–10.2)
CHOL/HDL RATIO: 5 ratio (ref 0.0–5.0)
CREATININE: 0.84 mg/dL (ref 0.76–1.27)
Chloride: 104 mmol/L (ref 96–106)
Cholesterol, Total: 164 mg/dL (ref 100–199)
EOS (ABSOLUTE): 0.2 10*3/uL (ref 0.0–0.4)
Eos: 3 %
Estimated CHD Risk: 1 times avg. (ref 0.0–1.0)
Free Thyroxine Index: 1.8 (ref 1.2–4.9)
GFR, EST AFRICAN AMERICAN: 133 mL/min/{1.73_m2} (ref >59)
GFR, EST NON AFRICAN AMERICAN: 115 mL/min/{1.73_m2} (ref >59)
GGT: 28 IU/L (ref 0–65)
GLOBULIN, TOTAL: 2.6 g/dL (ref 1.5–4.5)
GLUCOSE: 93 mg/dL (ref 65–99)
HDL: 33 mg/dL — AB (ref >39)
HEMATOCRIT: 44.4 % (ref 37.5–51.0)
Hemoglobin: 15.2 g/dL (ref 13.0–17.7)
IMMATURE GRANS (ABS): 0 10*3/uL (ref 0.0–0.1)
Immature Granulocytes: 0 %
Iron: 88 ug/dL (ref 38–169)
LDH: 202 IU/L (ref 121–224)
LDL CALC: 112 mg/dL — AB (ref 0–99)
LYMPHS: 36 %
Lymphocytes Absolute: 1.7 10*3/uL (ref 0.7–3.1)
MCH: 29.6 pg (ref 26.6–33.0)
MCHC: 34.2 g/dL (ref 31.5–35.7)
MCV: 87 fL (ref 79–97)
MONOCYTES: 5 %
Monocytes Absolute: 0.2 10*3/uL (ref 0.1–0.9)
NEUTROS ABS: 2.5 10*3/uL (ref 1.4–7.0)
Neutrophils: 56 %
PHOSPHORUS: 3.6 mg/dL (ref 2.5–4.5)
POTASSIUM: 4.2 mmol/L (ref 3.5–5.2)
Platelets: 213 10*3/uL (ref 150–379)
RBC: 5.13 x10E6/uL (ref 4.14–5.80)
RDW: 13.3 % (ref 12.3–15.4)
SODIUM: 140 mmol/L (ref 134–144)
T3 Uptake Ratio: 27 % (ref 24–39)
T4, Total: 6.6 ug/dL (ref 4.5–12.0)
TRIGLYCERIDES: 93 mg/dL (ref 0–149)
TSH: 3.3 u[IU]/mL (ref 0.450–4.500)
Total Protein: 6.7 g/dL (ref 6.0–8.5)
URIC ACID: 6.3 mg/dL (ref 3.7–8.6)
VLDL Cholesterol Cal: 19 mg/dL (ref 5–40)
WBC: 4.6 10*3/uL (ref 3.4–10.8)

## 2016-12-24 LAB — HCV COMMENT:

## 2016-12-24 LAB — HEPATITIS C ANTIBODY (REFLEX): HCV Ab: <0.1 s/co ratio (ref 0.0–0.9)

## 2016-12-24 LAB — VITAMIN D 25 HYDROXY (VIT D DEFICIENCY, FRACTURES): VIT D 25 HYDROXY: 24.4 ng/mL — AB (ref 30.0–100.0)

## 2017-01-09 DIAGNOSIS — I10 Essential (primary) hypertension: Secondary | ICD-10-CM | POA: Diagnosis not present

## 2017-01-09 DIAGNOSIS — Z79899 Other long term (current) drug therapy: Secondary | ICD-10-CM | POA: Diagnosis not present

## 2017-01-09 DIAGNOSIS — F17220 Nicotine dependence, chewing tobacco, uncomplicated: Secondary | ICD-10-CM | POA: Diagnosis not present

## 2017-01-09 DIAGNOSIS — M545 Low back pain: Secondary | ICD-10-CM | POA: Diagnosis present

## 2017-01-09 NOTE — ED Triage Notes (Signed)
Pt ambulatory to triage with steady gait, no distress noted.Pt reports he is being treated currently for herniated disk in lumbar region. Pt called orthopedic tonight due to increased lumbar back pain and was referred to come to ED for further evaluation and equina syndrome rule out. Per pt, he has been out of pain medication (percocet and flexeril) since last Monday.

## 2017-01-10 ENCOUNTER — Emergency Department: Payer: Managed Care, Other (non HMO)

## 2017-01-10 ENCOUNTER — Emergency Department
Admission: EM | Admit: 2017-01-10 | Discharge: 2017-01-10 | Disposition: A | Discharge status: Home / Self Care | Payer: Managed Care, Other (non HMO) | Attending: Emergency Medicine | Admitting: Emergency Medicine

## 2017-01-10 DIAGNOSIS — M545 Low back pain, unspecified: Secondary | ICD-10-CM

## 2017-01-10 DIAGNOSIS — R52 Pain, unspecified: Secondary | ICD-10-CM

## 2017-01-10 LAB — CBC WITH DIFFERENTIAL/PLATELET
Basophils Absolute: 0.1 10*3/uL (ref 0–0.1)
Basophils Relative: 1 %
Eosinophils Absolute: 0.2 10*3/uL (ref 0–0.7)
Eosinophils Relative: 2 %
HEMATOCRIT: 43.5 % (ref 40.0–52.0)
HEMOGLOBIN: 15.1 g/dL (ref 13.0–18.0)
LYMPHS ABS: 2.9 10*3/uL (ref 1.0–3.6)
LYMPHS PCT: 30 %
MCH: 29.8 pg (ref 26.0–34.0)
MCHC: 34.8 g/dL (ref 32.0–36.0)
MCV: 85.5 fL (ref 80.0–100.0)
Monocytes Absolute: 0.7 10*3/uL (ref 0.2–1.0)
Monocytes Relative: 8 %
NEUTROS PCT: 59 %
Neutro Abs: 5.8 10*3/uL (ref 1.4–6.5)
Platelets: 243 10*3/uL (ref 150–440)
RBC: 5.09 MIL/uL (ref 4.40–5.90)
RDW: 12.7 % (ref 11.5–14.5)
WBC: 9.7 10*3/uL (ref 3.8–10.6)

## 2017-01-10 LAB — BASIC METABOLIC PANEL
Anion gap: 7 (ref 5–15)
BUN: 9 mg/dL (ref 6–20)
CHLORIDE: 106 mmol/L (ref 101–111)
CO2: 26 mmol/L (ref 22–32)
Calcium: 9 mg/dL (ref 8.9–10.3)
Creatinine, Ser: 0.91 mg/dL (ref 0.61–1.24)
GFR calc Af Amer: >60 mL/min (ref >60)
GFR calc non Af Amer: >60 mL/min (ref >60)
GLUCOSE: 103 mg/dL — AB (ref 65–99)
Potassium: 3.5 mmol/L (ref 3.5–5.1)
SODIUM: 139 mmol/L (ref 135–145)

## 2017-01-10 MED ORDER — OXYCODONE-ACETAMINOPHEN 5-325 MG PO TABS
1.0000 | ORAL_TABLET | Freq: Once | ORAL | Status: AC
  Administered 2017-01-10: 1 via ORAL
  Filled 2017-01-10: qty 1

## 2017-01-10 MED ORDER — MORPHINE SULFATE (PF) 4 MG/ML IV SOLN
4.0000 mg | Freq: Once | INTRAVENOUS | Status: AC
  Administered 2017-01-10: 4 mg via INTRAVENOUS
  Filled 2017-01-10: qty 1

## 2017-01-10 MED ORDER — ONDANSETRON HCL 4 MG/2ML IJ SOLN
4.0000 mg | Freq: Once | INTRAMUSCULAR | Status: AC
  Administered 2017-01-10: 4 mg via INTRAVENOUS
  Filled 2017-01-10: qty 2

## 2017-01-10 MED ORDER — DIAZEPAM 2 MG PO TABS
2.0000 mg | ORAL_TABLET | Freq: Once | ORAL | Status: AC
  Administered 2017-01-10: 2 mg via ORAL
  Filled 2017-01-10: qty 1

## 2017-01-10 MED ORDER — OXYCODONE-ACETAMINOPHEN 5-325 MG PO TABS: 1.0000 | ORAL_TABLET | ORAL | 0 refills | Status: DC | PRN

## 2017-01-10 MED ORDER — DIAZEPAM 2 MG PO TABS: 2.0000 mg | ORAL_TABLET | Freq: Three times a day (TID) | ORAL | 0 refills | Status: DC | PRN

## 2017-01-10 NOTE — ED Provider Notes (Signed)
[name] Emergency Department Provider Note   ____________________________________________   First MD Initiated Contact with Patient 01/10/17 0131     (approximate)  I have reviewed the triage vital signs and the nursing notes.   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 Patient has a history of lumbar herniated disc; sees [name] neurosurgery. Had an MRI last week to evaluate for potential decompression surgery. Also ran out of his Percocet and Flexeril last week. Over the past 2-3 days, patient has noted numbness to his groin region and having difficulty making it to the bathroom for urination as well as bowel movements. States both legs feel weak. Spoke with neurosurgeon on-call who directed him to the nearest ED for MRI to evaluate for cauda equina syndrome. Denies associated fever, chills, chest pain, shortness of breath, abdominal pain, nausea, vomiting. Denies recent travel or trauma. Nothing makes his symptoms better or worse.   Past Medical History:  Diagnosis Date  . Depression   . Family history of premature CAD    a. in father with CAD s/p CABG in early 41s  . Hyperlipidemia   . Hypertension   . Morbid obesity ([place])   . Pituitary adenoma ([name])   . Sleep apnea    a. not on CPAP  . Spinal stenosis   . Tobacco abuse    a. dipping    Patient Active Problem List   Diagnosis Date Noted  . Pituitary microadenoma ([name]) 09/06/2016  . Lumbar herniated disc 08/30/2016  . Unstable angina ([name]) 03/04/2015  . Essential hypertension 08/18/2014  . Stenosis, spinal, lumbar 11/19/2013    Past Surgical History:  Procedure Laterality Date  . EYE SURGERY     secondary to trauma as a child    Prior to Admission medications   Medication Sig Start Date End Date Taking? Authorizing Provider  cyclobenzaprine  (FLEXERIL) 10 MG tablet Take 1 tablet (10 mg total) by mouth 3 (three) times daily as needed for muscle spasms. 09/30/16   Fisher, Linden Dolin, PA-C  diazepam (VALIUM) 2 MG tablet Take 1 tablet (2 mg total) by mouth every 8 (eight) hours as needed for muscle spasms. 01/10/17   Paulette Blanch, MD  lisinopril-hydrochlorothiazide (PRINZIDE,ZESTORETIC) 10-12.5 MG tablet TAKE 1 TABLET BY MOUTH DAILY. 11/18/16   Fisher, Linden Dolin, PA-C  oxyCODONE-acetaminophen (ROXICET) 5-325 MG tablet Take 1 tablet by mouth every 4 (four) hours as needed for severe pain. 01/10/17   Paulette Blanch, MD    Allergies Patient has no known allergies.  Family History  Problem Relation Age of Onset  . Heart disease Father   . Heart attack Father 13  . Hypertension Father   . Hyperlipidemia Father   . Mental illness Father        bipolar  . Hypertension Mother   . Cancer Maternal Aunt        breast  . Cancer Maternal Uncle 50       colon  . Cancer Maternal Grandmother     Social History Social History  Substance Use Topics  . Smoking status: Never Smoker  . Smokeless tobacco: Current User    Types: Chew  . Alcohol use Yes    Review of Systems  Constitutional: No fever/chills. Eyes: No visual changes. ENT: No sore throat. Cardiovascular: Denies chest pain. Respiratory:  Denies shortness of breath. Gastrointestinal: No abdominal pain.  No nausea, no vomiting.  No diarrhea.  No constipation. Genitourinary: Negative for dysuria. Musculoskeletal: Positive for back pain. Skin: Negative for rash. Neurological: Negative for headaches orfocal weakness. Positive for numbness.   ____________________________________________   PHYSICAL EXAM:  VITAL SIGNS: ED Triage Vitals  Enc Vitals Group     BP 01/09/17 2307 (!) 176/90     Pulse Rate 01/09/17 2307 88     Resp 01/09/17 2307 16     Temp 01/09/17 2307 98 F (36.7 C)     Temp Source 01/09/17 2307 Oral     SpO2 01/09/17 2307 100 %     Weight --      Height --       Head Circumference --      Peak Flow --      Pain Score 01/10/17 0132 9     Pain Loc --      Pain Edu? --      Excl. in [place]? --     Constitutional: Alert and oriented. Well appearing and in mild acute distress. Eyes: Conjunctivae are normal. PERRL. EOMI. Head: Atraumatic. Nose: No congestion/rhinnorhea. Mouth/Throat: Mucous membranes are moist.  Oropharynx non-erythematous. Neck: No stridor.  No cervical spine tenderness to palpation. Cardiovascular: Normal rate, regular rhythm. Grossly normal heart sounds.  Good peripheral circulation. Respiratory: Normal respiratory effort.  No retractions. Lungs CTAB. Gastrointestinal: Soft and nontender. No distention. No abdominal bruits. No CVA tenderness. Musculoskeletal: Midline lumbar spinal tenderness to palpation with paraspinal muscle spasms. No step-offs or deformities noted. Positive straight leg raise bilaterally at 30.  Neurologic:  Normal speech and language. No gross focal neurologic deficits are appreciated. Symmetrical motor strength and sensation BLE. Ambulates with slow but steady gait. Skin:  Skin is warm, dry and intact. No rash noted. Psychiatric: Mood and affect are normal. Speech and behavior are normal.  ____________________________________________   LABS (all labs ordered are listed, but only abnormal results are displayed)  Labs Reviewed  BASIC METABOLIC PANEL - Abnormal; Notable for the following:       Result Value   Glucose, Bld 103 (*)    All other components within normal limits  CBC WITH DIFFERENTIAL/PLATELET   ____________________________________________  EKG  None ____________________________________________  RADIOLOGY  Mr Lumbar Spine Wo Contrast  Result Date: 01/10/2017 CLINICAL DATA:  Initial evaluation for chronic back pain with weakness in legs common new tingling and groin. EXAM: MRI LUMBAR SPINE WITHOUT CONTRAST TECHNIQUE: Multiplanar, multisequence MR imaging of the lumbar spine was  performed. No intravenous contrast was administered. COMPARISON:  Prior MRI from 08/30/2016. FINDINGS: Segmentation: Normal segmentation. Lowest well-formed disc is labeled the L5-S1 level. Alignment: Straightening of the normal lumbar lordosis. No listhesis. Vertebrae: Vertebral body heights are maintained. No evidence for acute or chronic fracture. Signal intensity within the vertebral body bone marrow within normal limits. No discrete or worrisome osseous lesions. Conus medullaris: Extends to the L1 level and appears normal. Mild fibrofatty changes of the filum terminale noted. Paraspinal and other soft tissues: Paraspinous soft tissues within normal limits. Visualized visceral structures are normal. Disc levels: L1-2:  Unremarkable. L2-3: Large central disc extrusion measuring approximately 6 mm in AP diameter, similar to previous. Similar approximately 15 mm of cephalad and caudad migration. Extrusion is subligamentous in location. Resultant severe canal stenosis with the thecal sac measuring 3 mm in AP diameter at its most narrow point. Severe bilateral subarticular stenosis, slightly worse on the right. Changes relatively similar to  previous. No significant foraminal encroachment. L3-4: Central disc protrusion with slight inferior migration. Size is relatively stable. Stable mild canal and lateral recess stenosis. No foraminal encroachment. L4-5: Central disc protrusion measuring approximately 5 mm, stable. Slight inferior migration, unchanged. Resultant mild canal with mild to moderate lateral recess stenosis, stable. Stable mild right with mild to moderate left neural foraminal stenosis. L5-S1: Broad-based disc bulge, asymmetric to the right, contacts the descending S1 nerve roots, particularly on the right. Mild facet arthropathy, greater on the left. No significant canal stenosis. Mild to moderate bilateral foraminal narrowing, slightly greater on the left. IMPRESSION: 1. No significant interval change in  appearance of the lumbar spine as compared to previous MRI from 08/30/2016. 2. Large central disc extrusion at L2-3 with resultant severe canal stenosis as above. 3. Smaller central disc protrusions at L3-4 and L4-5, with similar broad posterior disc bulge at L5-S1. 4. Stable mild-to-moderate neural foraminal narrowing at L4-5 and L5-S1. Electronically Signed   By: Jeannine Boga M.D.   On: 01/10/2017 03:31    ____________________________________________   PROCEDURES  Procedure(s) performed:   Rectal exam: External exam WNL; no hemorrhoids. Sensation WNL. Excellent rectal tone.  Procedures  Critical Care performed: No  ____________________________________________   INITIAL IMPRESSION / ASSESSMENT AND PLAN / ED COURSE  Pertinent labs & imaging results that were available during my care of the patient were reviewed by me and considered in my medical decision making (see chart for details).  31 year old male who presents with acute on chronic lower back pain with symptoms concerning for cauda equina syndrome. Although patient just had an MRI 3 days ago, given his new complaints, will repeat MRI lumbar spine.  Clinical Course as of Jan 11 656  Tue Jan 10, 2017  0449 Updated patient of MRI results. Patient was able to pull up his MRI from 3 days ago; impression is very similar to tonight's study. He has an appointment with his neurosurgeon in 2 days. Will give him a disc of his MRI to take to his neurosurgeon. Will discharge home with prescription for Percocet and Valium. Strict return precautions given. Patient verbalizes understanding and agrees with plan of care.  [JS]    Clinical Course User Index [JS] Paulette Blanch, MD     ____________________________________________   FINAL CLINICAL IMPRESSION(S) / ED DIAGNOSES  Final diagnoses:  Pain  Acute bilateral low back pain without sciatica      NEW MEDICATIONS STARTED DURING THIS VISIT:  Discharge Medication List as of  01/10/2017  5:02 AM    START taking these medications   Details  diazepam (VALIUM) 2 MG tablet Take 1 tablet (2 mg total) by mouth every 8 (eight) hours as needed for muscle spasms., Starting Tue 01/10/2017, Print    oxyCODONE-acetaminophen (ROXICET) 5-325 MG tablet Take 1 tablet by mouth every 4 (four) hours as needed for severe pain., Starting Tue 01/10/2017, Print         Note:  This document was prepared using Dragon voice recognition software and may include unintentional dictation errors.    Paulette Blanch, MD 01/10/17 [phone]

## 2017-01-10 NOTE — ED Notes (Signed)
Patient transported to MRI 

## 2017-01-10 NOTE — ED Notes (Signed)

## 2017-01-10 NOTE — ED Notes (Signed)
Bladder scan shows 15 ml of urine in bladder. MD notified.

## 2017-01-10 NOTE — ED Notes (Signed)
Pt reports to ED w/ c/o changes to chronic back pain. Pt sts that pain has traveled upwards in spine to mid back. Pt reports weakness to legs, sts that it feels more of an effort to stand. Pt reports new tingling to groin and feels less able to control bowels/bladder. Pt sts that he is uncomfortable sitting/standing for extended periods of time, MAE. Denies new injury to area.

## 2017-01-10 NOTE — Discharge Instructions (Signed)
1. You may take medicines as needed for pain and muscle spasms (Percocet/Valium #15). 2. Apply moist heat to affected area several times daily. 3. Return to the ER for worsening symptoms, persistent vomiting, extremity weakness, bowel or bladder incontinence or other concerns.

## 2017-01-17 ENCOUNTER — Encounter (HOSPITAL_COMMUNITY): Payer: Self-pay | Admitting: *Deleted

## 2017-01-17 ENCOUNTER — Ambulatory Visit: Payer: Self-pay | Admitting: Orthopedic Surgery

## 2017-01-17 NOTE — H&P (Signed)
Seth Parker is an 31 y.o. male.   Chief Complaint: back and leg pain HPI: The patient is a 31 year old male who presents today for follow up of their back. The patient is being followed for their back pain. They are now 7 weeks and 3 days out from injury (DOI 11/22/16). Symptoms reported today include: pain. Current treatment includes: activity modification, pain medications (Oxycodone) and Valium. The following medication has been used for pain control: Oxycodone. The patient reports their current pain level to be mild (to severe). The patient presents today following MRI.  Seth Parker follows up after his trip to the emergency room. He had an episode apparently last week where there was a question whether he had some urinary incontinence. He called Dr. Netta Cedars who was on call for [place] who discussed this with me. He indicated that he was seen at the [facility] Emergency Room and they did a repeat MRI of his lumbar spine again showing the disc herniation at L2-3. He had apparently full workup, they did not feel he had a cauda equina syndrome with no retained fluid postvoid in his bladder. He still reports pain in both sides. His injury he had when he was at work and lifting resulted in pain down both legs. In the past, it was only down into the left. When he stands too long, coughs or sneeze, he develops bilateral leg pain. He currently today reports no numbness into his groin or calf and normal bowel and bladder function. He is here with his father.  He reports he is 315 pounds.  REVIEW OF SYSTEMS No fevers or chills.  Past Medical History:  Diagnosis Date  . Anxiety   . Depression   . Family history of premature CAD    a. in father with CAD s/p CABG in early 72s  . GERD (gastroesophageal reflux disease)   . Hyperlipidemia   . Hypertension   . Lumbar spinal stenosis    herniated discs  . Morbid obesity ([place])   . Pituitary adenoma ([place])   . Sleep apnea    uses cpap some nights set oon 7    . Spinal stenosis   . Tobacco abuse    a. dipping    Past Surgical History:  Procedure Laterality Date  . colonscopy  2-3 yrs ago  . EYE SURGERY Left    secondary to trauma as a child    Family History  Problem Relation Age of Onset  . Heart disease Father   . Heart attack Father 57  . Hypertension Father   . Hyperlipidemia Father   . Mental illness Father        bipolar  . Hypertension Mother   . Cancer Maternal Aunt        breast  . Cancer Maternal Uncle 50       colon  . Cancer Maternal Grandmother    Social History:  reports that he has never smoked. His smokeless tobacco use includes Chew. He reports that he drinks alcohol. He reports that he does not use drugs.  Allergies: No Known Allergies   (Not in a hospital admission)  No results found for this or any previous visit (from the past 48 hour(s)). No results found.  Review of Systems  Constitutional: Negative.   HENT: Negative.   Eyes: Negative.   Respiratory: Negative.   Cardiovascular: Negative.   Gastrointestinal: Negative.   Genitourinary: Negative.   Musculoskeletal: Positive for back pain.  Skin: Negative.  Neurological: Positive for sensory change and focal weakness.  Psychiatric/Behavioral: Negative.     There were no vitals taken for this visit. Physical Exam  Constitutional: He is oriented to person, place, and time. He appears well-developed. He appears distressed.  HENT:  Head: Normocephalic.  Eyes: Pupils are equal, round, and reactive to light.  Neck: Normal range of motion.  Cardiovascular: Normal rate.   Respiratory: Effort normal.  GI: Soft.  Musculoskeletal:  He is standing, in moderate distress. Mood and affect is appropriate. He has pain, he is unable to extend. He forward flexes with pain in his lower back.  He has slight hip flexor weakness bilaterally. Straight leg raise is with buttock pain. Slight hip flexor weakness on the left. Lumbar spine exam reveals no evidence  of soft tissue swelling, deformity or skin ecchymosis. On palpation there is no tenderness of the lumbar spine. No flank pain with percussion. The abdomen is soft and nontender. Nontender over the trochanters. No cellulitis or lymphadenopathy.  Good range of motion of the lumbar spine without associated pain. Motor is 5/5 including EHL, tibialis anterior, plantar flexion, quadriceps and hamstrings. Patient is normoreflexic. There is no Babinski or clonus. Sensory exam is intact to light touch. Patient has good distal pulses. No DVT. No pain and normal range of motion without instability of the hips, knees and ankles. He has normal peroneal sensation. Good motor function distally.  Inspection of the cervical spine reveals a normal lordosis without evidence of paraspinous spasms or soft tissue swelling. Nontender to palpation. Full flexion, full extension, full left and right lateral rotation. Extension combined with lateral flexion does not reproduce pain. Negative impingement sign, negative secondary impingement sign of the shoulders. Negative Tinel's median and ulnar nerves at the elbow. Negative carpal compression test at the wrist. Motor of the upper extremities is 5/5 including biceps, triceps, brachioradialis, wrist flexion, wrist extension, finger flexion, finger extension. Reflexes are normoreflexic. Sensory exam is intact to light touch. There is no Hoffmann sign. Nontender over the thoracic spine.  Neurological: He is alert and oriented to person, place, and time.  Skin: Skin is warm and dry.    His MRI here at the [facility] demonstrates a large disc herniation at L2-3 with severe stenosis at L2-3 centrally. He has a disc herniation at L3-4 producing only mild mass effect. 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  Assessment/Plan Bilateral radiculopathy secondary to a central disc herniation at L2-3. Episode of possible near incontinence a week ago  with the workup indicating in [place], he is currently out of work, unable to do that.  I had an extensive discussion with he and his father concerning his current pathology, relevant anatomy and treatment options. I agree given the presence of a neural compressive lesion at L2-3 that I would currently recommend at this point in time given his pain appears to be worsening that we proceed with a micro lumbar decompression at L2-3 centrally to relieve the compression of the thecal sac to prevent the progression neurologic deficit cauda equina etc. I do agree with the emergency room that we will keep him out of work at this point. We discussed favoring flexion, avoiding extension, avoiding valsalva maneuvers or lifting that my worsen his disc herniation and his neurologic situation. He has nodded he knows. He is also instructed on signs and symptoms of cauda equina, present to the emergency room emergently and if not attended within 24 hours it leads to permanent  neurologic deficit. I will expedite this decompression. He is 315 pounds. We discussed post decompression activity, weight reduction, etc. He has multilevel disc degeneration. We also discussed possibility of recurrent disc herniation, needing further surgery or fusion. I do not feel L3-4 needs to be decompressed at this point.  Gave him a prescription for Percocet to be taken twice a day as needed. Gentle stretching, stationary bike. Pool, work. I gave a note to be out of work as again given his recent experience and his MRI, I would recommend proceeding with a decompression.  Again, he had reported at the conclusion of treatment of his previous disc herniation that he had no symptoms and was released to work at full duties. His lifting injury generated symptoms bilaterally, not only left that he had prior, but into the right. Upon my view I feel that his MRI shows a slight progression of this L2-3 disc herniations. It suggests exacerbation of that  underlying condition. I would therefore suggest the need for surgical intervention secondary to his additional work injury. Discussed with he and his father. We will proceed ASAP. I had an extensive discussion of the risks and benefits of the lumbar decompression with the patient including bleeding, infection, damage to neurovascular structures, epidural fibrosis, CSF leak requiring repair. We also discussed increase in pain, adjacent segment disease, recurrent disc herniation, need for future surgery including repeat decompression and/or fusion. We also discussed risks of postoperative hematoma, paralysis, anesthetic complications including DVT, PE, death, cardiopulmonary dysfunction. In addition, the perioperative and postoperative courses were discussed in detail including the rehabilitative time and return to functional activity and work. I provided the patient with an illustrated handout and utilized the appropriate surgical models.  Plan microlumbar decompression L2-3, possible L1-2  Vici Novick, Conley Rolls., PA-C for Dr. Tonita Cong 01/17/2017, 4:56 PM

## 2017-01-18 ENCOUNTER — Encounter (HOSPITAL_COMMUNITY): Payer: Self-pay

## 2017-01-18 ENCOUNTER — Ambulatory Visit (HOSPITAL_COMMUNITY)
Admission: RE | Admit: 2017-01-18 | Discharge: 2017-01-20 | Disposition: A | Discharge status: Home / Self Care | Payer: Worker's Compensation | Source: Ambulatory Visit | Attending: Specialist | Admitting: Specialist

## 2017-01-18 ENCOUNTER — Ambulatory Visit (HOSPITAL_COMMUNITY): Payer: Worker's Compensation

## 2017-01-18 ENCOUNTER — Ambulatory Visit (HOSPITAL_COMMUNITY): Payer: Worker's Compensation | Admitting: Anesthesiology

## 2017-01-18 ENCOUNTER — Encounter (HOSPITAL_COMMUNITY)
Admission: RE | Disposition: A | Discharge status: Home / Self Care | Payer: Self-pay | Source: Ambulatory Visit | Attending: Specialist

## 2017-01-18 DIAGNOSIS — M48062 Spinal stenosis, lumbar region with neurogenic claudication: Secondary | ICD-10-CM | POA: Diagnosis not present

## 2017-01-18 DIAGNOSIS — M5117 Intervertebral disc disorders with radiculopathy, lumbosacral region: Secondary | ICD-10-CM | POA: Diagnosis not present

## 2017-01-18 DIAGNOSIS — Y990 Civilian activity done for income or pay: Secondary | ICD-10-CM | POA: Diagnosis not present

## 2017-01-18 DIAGNOSIS — S33121A Dislocation of L2/L3 lumbar vertebra, initial encounter: Secondary | ICD-10-CM | POA: Diagnosis not present

## 2017-01-18 DIAGNOSIS — F17290 Nicotine dependence, other tobacco product, uncomplicated: Secondary | ICD-10-CM | POA: Insufficient documentation

## 2017-01-18 DIAGNOSIS — Z419 Encounter for procedure for purposes other than remedying health state, unspecified: Secondary | ICD-10-CM

## 2017-01-18 DIAGNOSIS — G4730 Sleep apnea, unspecified: Secondary | ICD-10-CM | POA: Diagnosis not present

## 2017-01-18 DIAGNOSIS — I10 Essential (primary) hypertension: Secondary | ICD-10-CM | POA: Diagnosis not present

## 2017-01-18 DIAGNOSIS — Z79899 Other long term (current) drug therapy: Secondary | ICD-10-CM | POA: Diagnosis not present

## 2017-01-18 DIAGNOSIS — Z6841 Body Mass Index (BMI) 40.0 and over, adult: Secondary | ICD-10-CM | POA: Diagnosis not present

## 2017-01-18 DIAGNOSIS — X509XXA Other and unspecified overexertion or strenuous movements or postures, initial encounter: Secondary | ICD-10-CM | POA: Insufficient documentation

## 2017-01-18 DIAGNOSIS — M5126 Other intervertebral disc displacement, lumbar region: Secondary | ICD-10-CM

## 2017-01-18 DIAGNOSIS — F419 Anxiety disorder, unspecified: Secondary | ICD-10-CM | POA: Insufficient documentation

## 2017-01-18 HISTORY — PX: LUMBAR LAMINECTOMY/DECOMPRESSION MICRODISCECTOMY: SHX5026

## 2017-01-18 HISTORY — DX: Gastro-esophageal reflux disease without esophagitis: K21.9

## 2017-01-18 HISTORY — DX: Anxiety disorder, unspecified: F41.9

## 2017-01-18 HISTORY — DX: Spinal stenosis, lumbar region without neurogenic claudication: M48.061

## 2017-01-18 LAB — SURGICAL PCR SCREEN
MRSA, PCR: NEGATIVE
STAPHYLOCOCCUS AUREUS: NEGATIVE

## 2017-01-18 SURGERY — LUMBAR LAMINECTOMY/DECOMPRESSION MICRODISCECTOMY 2 LEVELS
Anesthesia: General | Site: Back

## 2017-01-18 MED ORDER — KCL IN DEXTROSE-NACL 20-5-0.45 MEQ/L-%-% IV SOLN
INTRAVENOUS | Status: AC
  Administered 2017-01-18: 50 mL/h via INTRAVENOUS
  Filled 2017-01-18 (×2): qty 1000

## 2017-01-18 MED ORDER — THROMBIN 5000 UNITS EX SOLR
CUTANEOUS | Status: DC | PRN
  Administered 2017-01-18 (×3): 5000 [IU] via TOPICAL

## 2017-01-18 MED ORDER — MIDAZOLAM HCL 2 MG/2ML IJ SOLN
INTRAMUSCULAR | Status: DC | PRN
  Administered 2017-01-18: 2 mg via INTRAVENOUS

## 2017-01-18 MED ORDER — PHENOL 1.4 % MT LIQD: 1.0000 | OROMUCOSAL | Status: DC | PRN

## 2017-01-18 MED ORDER — MAGNESIUM CITRATE PO SOLN: 1.0000 | Freq: Once | ORAL | Status: DC | PRN

## 2017-01-18 MED ORDER — DEXAMETHASONE SODIUM PHOSPHATE 10 MG/ML IJ SOLN
INTRAMUSCULAR | Status: AC
  Filled 2017-01-18: qty 1

## 2017-01-18 MED ORDER — DEXTROSE 5 % IV SOLN
3.0000 g | INTRAVENOUS | Status: AC
  Administered 2017-01-18: 3 g via INTRAVENOUS
  Filled 2017-01-18: qty 3

## 2017-01-18 MED ORDER — ACETAMINOPHEN 650 MG RE SUPP: 650.0000 mg | RECTAL | Status: DC | PRN

## 2017-01-18 MED ORDER — CEFAZOLIN SODIUM-DEXTROSE 2-4 GM/100ML-% IV SOLN
2.0000 g | Freq: Three times a day (TID) | INTRAVENOUS | Status: AC
  Administered 2017-01-18 – 2017-01-19 (×3): 2 g via INTRAVENOUS
  Filled 2017-01-18 (×3): qty 100

## 2017-01-18 MED ORDER — DOCUSATE SODIUM 100 MG PO CAPS
100.0000 mg | ORAL_CAPSULE | Freq: Two times a day (BID) | ORAL | Status: DC
  Administered 2017-01-18 – 2017-01-20 (×4): 100 mg via ORAL
  Filled 2017-01-18 (×4): qty 1

## 2017-01-18 MED ORDER — FENTANYL CITRATE (PF) 100 MCG/2ML IJ SOLN
INTRAMUSCULAR | Status: AC
  Filled 2017-01-18: qty 2

## 2017-01-18 MED ORDER — FENTANYL CITRATE (PF) 100 MCG/2ML IJ SOLN
INTRAMUSCULAR | Status: DC | PRN
  Administered 2017-01-18: 150 ug via INTRAVENOUS
  Administered 2017-01-18: 100 ug via INTRAVENOUS
  Administered 2017-01-18 (×4): 50 ug via INTRAVENOUS

## 2017-01-18 MED ORDER — HYDROCHLOROTHIAZIDE 12.5 MG PO CAPS
12.5000 mg | ORAL_CAPSULE | Freq: Every day | ORAL | Status: DC
  Administered 2017-01-19: 12.5 mg via ORAL
  Filled 2017-01-18: qty 1

## 2017-01-18 MED ORDER — POLYETHYLENE GLYCOL 3350 17 G PO PACK: 17.0000 g | PACK | Freq: Every day | ORAL | 0 refills | Status: DC

## 2017-01-18 MED ORDER — PROPOFOL 10 MG/ML IV BOLUS
INTRAVENOUS | Status: AC
  Filled 2017-01-18: qty 20

## 2017-01-18 MED ORDER — LIDOCAINE 2% (20 MG/ML) 5 ML SYRINGE
INTRAMUSCULAR | Status: DC | PRN
  Administered 2017-01-18: 100 mg via INTRAVENOUS

## 2017-01-18 MED ORDER — ONDANSETRON HCL 4 MG/2ML IJ SOLN: 4.0000 mg | Freq: Four times a day (QID) | INTRAMUSCULAR | Status: DC | PRN

## 2017-01-18 MED ORDER — OXYCODONE-ACETAMINOPHEN 7.5-325 MG PO TABS: 1.0000 | ORAL_TABLET | ORAL | 0 refills | Status: DC | PRN

## 2017-01-18 MED ORDER — BISACODYL 5 MG PO TBEC: 5.0000 mg | DELAYED_RELEASE_TABLET | Freq: Every day | ORAL | Status: DC | PRN

## 2017-01-18 MED ORDER — SODIUM CHLORIDE 0.9 % IV SOLN
INTRAVENOUS | Status: DC | PRN
  Administered 2017-01-18: 500 mL

## 2017-01-18 MED ORDER — HYDROMORPHONE HCL-NACL 0.5-0.9 MG/ML-% IV SOSY
PREFILLED_SYRINGE | INTRAVENOUS | Status: AC
  Administered 2017-01-18: 0.5 mg
  Filled 2017-01-18: qty 2

## 2017-01-18 MED ORDER — DEXAMETHASONE SODIUM PHOSPHATE 10 MG/ML IJ SOLN
INTRAMUSCULAR | Status: DC | PRN
  Administered 2017-01-18: 10 mg via INTRAVENOUS

## 2017-01-18 MED ORDER — BUPIVACAINE-EPINEPHRINE (PF) 0.5% -1:200000 IJ SOLN
INTRAMUSCULAR | Status: AC
  Filled 2017-01-18: qty 30

## 2017-01-18 MED ORDER — OXYCODONE HCL 5 MG PO TABS
5.0000 mg | ORAL_TABLET | ORAL | Status: DC | PRN
  Administered 2017-01-18: 15 mg via ORAL
  Administered 2017-01-18: 5 mg via ORAL
  Administered 2017-01-19 – 2017-01-20 (×9): 15 mg via ORAL
  Filled 2017-01-18: qty 3
  Filled 2017-01-18: qty 1
  Filled 2017-01-18 (×9): qty 3

## 2017-01-18 MED ORDER — FENTANYL CITRATE (PF) 100 MCG/2ML IJ SOLN
25.0000 ug | INTRAMUSCULAR | Status: DC | PRN
Start: 2017-01-18 — End: 2017-01-18

## 2017-01-18 MED ORDER — DOCUSATE SODIUM 100 MG PO CAPS: 100.0000 mg | ORAL_CAPSULE | Freq: Two times a day (BID) | ORAL | 1 refills | Status: DC | PRN

## 2017-01-18 MED ORDER — ROCURONIUM BROMIDE 50 MG/5ML IV SOSY
PREFILLED_SYRINGE | INTRAVENOUS | Status: AC
  Filled 2017-01-18: qty 5

## 2017-01-18 MED ORDER — SODIUM CHLORIDE 0.9 % IV SOLN
INTRAVENOUS | Status: AC
  Filled 2017-01-18: qty 500000

## 2017-01-18 MED ORDER — POLYETHYLENE GLYCOL 3350 17 G PO PACK: 17.0000 g | PACK | Freq: Every day | ORAL | Status: DC | PRN

## 2017-01-18 MED ORDER — OXYCODONE HCL 5 MG PO TABS
5.0000 mg | ORAL_TABLET | ORAL | Status: DC | PRN
  Administered 2017-01-18: 17:00:00 10 mg via ORAL
  Filled 2017-01-18: qty 2

## 2017-01-18 MED ORDER — LABETALOL HCL 5 MG/ML IV SOLN
INTRAVENOUS | Status: DC | PRN
  Administered 2017-01-18 (×2): 5 mg via INTRAVENOUS

## 2017-01-18 MED ORDER — METHOCARBAMOL 500 MG PO TABS
500.0000 mg | ORAL_TABLET | Freq: Four times a day (QID) | ORAL | Status: DC | PRN
  Administered 2017-01-19 – 2017-01-20 (×4): 500 mg via ORAL
  Filled 2017-01-18 (×4): qty 1

## 2017-01-18 MED ORDER — METHOCARBAMOL 1000 MG/10ML IJ SOLN
500.0000 mg | Freq: Four times a day (QID) | INTRAMUSCULAR | Status: DC | PRN
  Administered 2017-01-18: 500 mg via INTRAVENOUS
  Filled 2017-01-18: qty 550

## 2017-01-18 MED ORDER — ONDANSETRON HCL 4 MG/2ML IJ SOLN
INTRAMUSCULAR | Status: DC | PRN
  Administered 2017-01-18: 4 mg via INTRAVENOUS

## 2017-01-18 MED ORDER — THROMBIN 5000 UNITS EX SOLR
CUTANEOUS | Status: AC
  Filled 2017-01-18: qty 5000

## 2017-01-18 MED ORDER — MIDAZOLAM HCL 2 MG/2ML IJ SOLN
INTRAMUSCULAR | Status: AC
  Filled 2017-01-18: qty 2

## 2017-01-18 MED ORDER — LIDOCAINE 2% (20 MG/ML) 5 ML SYRINGE
INTRAMUSCULAR | Status: AC
  Filled 2017-01-18: qty 5

## 2017-01-18 MED ORDER — ROCURONIUM BROMIDE 50 MG/5ML IV SOSY
PREFILLED_SYRINGE | INTRAVENOUS | Status: AC
Start: 2017-01-18 — End: 2017-01-18
  Filled 2017-01-18: qty 5

## 2017-01-18 MED ORDER — SUCCINYLCHOLINE CHLORIDE 200 MG/10ML IV SOSY
PREFILLED_SYRINGE | INTRAVENOUS | Status: AC
  Filled 2017-01-18: qty 10

## 2017-01-18 MED ORDER — ACETAMINOPHEN 325 MG PO TABS: 650.0000 mg | ORAL_TABLET | ORAL | Status: DC | PRN

## 2017-01-18 MED ORDER — ROCURONIUM BROMIDE 10 MG/ML (PF) SYRINGE
PREFILLED_SYRINGE | INTRAVENOUS | Status: DC | PRN
  Administered 2017-01-18: 20 mg via INTRAVENOUS
  Administered 2017-01-18 (×2): 10 mg via INTRAVENOUS
  Administered 2017-01-18: 50 mg via INTRAVENOUS
  Administered 2017-01-18 (×3): 10 mg via INTRAVENOUS

## 2017-01-18 MED ORDER — ONDANSETRON HCL 4 MG/2ML IJ SOLN
INTRAMUSCULAR | Status: AC
  Filled 2017-01-18: qty 2

## 2017-01-18 MED ORDER — PROPOFOL 10 MG/ML IV BOLUS
INTRAVENOUS | Status: DC | PRN
  Administered 2017-01-18: 300 mg via INTRAVENOUS

## 2017-01-18 MED ORDER — THROMBIN 5000 UNITS EX SOLR
CUTANEOUS | Status: AC
  Filled 2017-01-18: qty 10000

## 2017-01-18 MED ORDER — ONDANSETRON HCL 4 MG PO TABS: 4.0000 mg | ORAL_TABLET | Freq: Four times a day (QID) | ORAL | Status: DC | PRN

## 2017-01-18 MED ORDER — LISINOPRIL-HYDROCHLOROTHIAZIDE 10-12.5 MG PO TABS: 1.0000 | ORAL_TABLET | Freq: Every day | ORAL | Status: DC

## 2017-01-18 MED ORDER — LISINOPRIL 10 MG PO TABS
10.0000 mg | ORAL_TABLET | Freq: Every day | ORAL | Status: DC
  Administered 2017-01-19: 09:00:00 10 mg via ORAL
  Filled 2017-01-18: qty 1

## 2017-01-18 MED ORDER — RISAQUAD PO CAPS
1.0000 | ORAL_CAPSULE | Freq: Every day | ORAL | Status: DC
  Administered 2017-01-18 – 2017-01-20 (×3): 1 via ORAL
  Filled 2017-01-18 (×3): qty 1

## 2017-01-18 MED ORDER — ALUM & MAG HYDROXIDE-SIMETH 200-200-20 MG/5ML PO SUSP: 30.0000 mL | Freq: Four times a day (QID) | ORAL | Status: DC | PRN

## 2017-01-18 MED ORDER — HYDROMORPHONE HCL-NACL 0.5-0.9 MG/ML-% IV SOSY: 1.0000 mg | PREFILLED_SYRINGE | INTRAVENOUS | Status: DC | PRN

## 2017-01-18 MED ORDER — SUGAMMADEX SODIUM 200 MG/2ML IV SOLN
INTRAVENOUS | Status: DC | PRN
  Administered 2017-01-18: 350 mg via INTRAVENOUS

## 2017-01-18 MED ORDER — FENTANYL CITRATE (PF) 250 MCG/5ML IJ SOLN
INTRAMUSCULAR | Status: AC
  Filled 2017-01-18: qty 5

## 2017-01-18 MED ORDER — LACTATED RINGERS IV SOLN
INTRAVENOUS | Status: DC
  Administered 2017-01-18 (×2): via INTRAVENOUS

## 2017-01-18 MED ORDER — HYDROMORPHONE HCL-NACL 0.5-0.9 MG/ML-% IV SOSY
0.2500 mg | PREFILLED_SYRINGE | INTRAVENOUS | Status: DC | PRN
  Administered 2017-01-18 (×4): 0.5 mg via INTRAVENOUS

## 2017-01-18 MED ORDER — SUGAMMADEX SODIUM 500 MG/5ML IV SOLN
INTRAVENOUS | Status: AC
  Filled 2017-01-18: qty 5

## 2017-01-18 MED ORDER — HYDROMORPHONE HCL-NACL 0.5-0.9 MG/ML-% IV SOSY
PREFILLED_SYRINGE | INTRAVENOUS | Status: AC
  Filled 2017-01-18: qty 2

## 2017-01-18 MED ORDER — BUPIVACAINE-EPINEPHRINE (PF) 0.5% -1:200000 IJ SOLN
INTRAMUSCULAR | Status: DC | PRN
  Administered 2017-01-18: 20 mL

## 2017-01-18 MED ORDER — METHOCARBAMOL 500 MG PO TABS: 500.0000 mg | ORAL_TABLET | Freq: Four times a day (QID) | ORAL | 1 refills | Status: DC | PRN

## 2017-01-18 MED ORDER — SUCCINYLCHOLINE CHLORIDE 200 MG/10ML IV SOSY
PREFILLED_SYRINGE | INTRAVENOUS | Status: DC | PRN
  Administered 2017-01-18: 200 mg via INTRAVENOUS

## 2017-01-18 MED ORDER — MENTHOL 3 MG MT LOZG: 1.0000 | LOZENGE | OROMUCOSAL | Status: DC | PRN

## 2017-01-18 SURGICAL SUPPLY — 52 items
BAG ZIPLOCK 12X15 (MISCELLANEOUS) IMPLANT
CLEANER TIP ELECTROSURG 2X2 (MISCELLANEOUS) ×3 IMPLANT
CLOSURE WOUND 1/2 X4 (GAUZE/BANDAGES/DRESSINGS)
CLOTH 2% CHLOROHEXIDINE 3PK (PERSONAL CARE ITEMS) ×3 IMPLANT
COVER SURGICAL LIGHT HANDLE (MISCELLANEOUS) ×3 IMPLANT
DRAPE MICROSCOPE LEICA (MISCELLANEOUS) ×3 IMPLANT
DRAPE SHEET LG 3/4 BI-LAMINATE (DRAPES) IMPLANT
DRAPE SURG 17X11 SM STRL (DRAPES) ×3 IMPLANT
DRAPE UTILITY XL STRL (DRAPES) ×3 IMPLANT
DRSG AQUACEL AG ADV 3.5X 4 (GAUZE/BANDAGES/DRESSINGS) IMPLANT
DRSG AQUACEL AG ADV 3.5X 6 (GAUZE/BANDAGES/DRESSINGS) ×3 IMPLANT
DURAPREP 26ML APPLICATOR (WOUND CARE) ×3 IMPLANT
DURASEAL SPINE SEALANT 3ML (MISCELLANEOUS) IMPLANT
ELECT BLADE TIP CTD 4 INCH (ELECTRODE) IMPLANT
ELECT REM PT RETURN 15FT ADLT (MISCELLANEOUS) ×3 IMPLANT
GLOVE BIOGEL PI IND STRL 6.5 (GLOVE) ×6 IMPLANT
GLOVE BIOGEL PI IND STRL 7.0 (GLOVE) ×1 IMPLANT
GLOVE BIOGEL PI INDICATOR 6.5 (GLOVE) ×12
GLOVE BIOGEL PI INDICATOR 7.0 (GLOVE) ×2
GLOVE SURG SS PI 7.0 STRL IVOR (GLOVE) ×3 IMPLANT
GLOVE SURG SS PI 8.0 STRL IVOR (GLOVE) ×6 IMPLANT
GOWN STRL REUS W/ TWL LRG LVL3 (GOWN DISPOSABLE) ×3 IMPLANT
GOWN STRL REUS W/TWL LRG LVL3 (GOWN DISPOSABLE) ×6
GOWN STRL REUS W/TWL XL LVL3 (GOWN DISPOSABLE) ×6 IMPLANT
HEMOSTAT SPONGE AVITENE ULTRA (HEMOSTASIS) IMPLANT
IV CATH 14GX2 1/4 (CATHETERS) IMPLANT
KIT BASIN OR (CUSTOM PROCEDURE TRAY) ×3 IMPLANT
KIT POSITIONING SURG ANDREWS (MISCELLANEOUS) ×3 IMPLANT
MANIFOLD NEPTUNE II (INSTRUMENTS) ×3 IMPLANT
MARKER SKIN DUAL TIP RULER LAB (MISCELLANEOUS) ×3 IMPLANT
NEEDLE SPNL 18GX3.5 QUINCKE PK (NEEDLE) ×6 IMPLANT
PACK LAMINECTOMY ORTHO (CUSTOM PROCEDURE TRAY) ×3 IMPLANT
PATTIES SURGICAL .5 X.5 (GAUZE/BANDAGES/DRESSINGS) ×6 IMPLANT
PATTIES SURGICAL .75X.75 (GAUZE/BANDAGES/DRESSINGS) ×3 IMPLANT
PATTIES SURGICAL 1X1 (DISPOSABLE) IMPLANT
RUBBERBAND STERILE (MISCELLANEOUS) ×3 IMPLANT
SPONGE LAP 4X18 X RAY DECT (DISPOSABLE) ×6 IMPLANT
SPONGE SURGIFOAM ABS GEL 100 (HEMOSTASIS) ×3 IMPLANT
STAPLER VISISTAT (STAPLE) ×3 IMPLANT
STRIP CLOSURE SKIN 1/2X4 (GAUZE/BANDAGES/DRESSINGS) IMPLANT
SUT NURALON 4 0 TR CR/8 (SUTURE) IMPLANT
SUT PROLENE 3 0 PS 2 (SUTURE) IMPLANT
SUT VIC AB 1 CT1 27 (SUTURE)
SUT VIC AB 1 CT1 27XBRD ANTBC (SUTURE) IMPLANT
SUT VIC AB 1-0 CT2 27 (SUTURE) ×9 IMPLANT
SUT VIC AB 2-0 CT1 27 (SUTURE) ×4
SUT VIC AB 2-0 CT1 TAPERPNT 27 (SUTURE) ×2 IMPLANT
SUT VIC AB 2-0 CT2 27 (SUTURE) IMPLANT
SYR 3ML LL SCALE MARK (SYRINGE) IMPLANT
TOWEL OR 17X26 10 PK STRL BLUE (TOWEL DISPOSABLE) ×3 IMPLANT
TOWEL OR NON WOVEN STRL DISP B (DISPOSABLE) ×3 IMPLANT
YANKAUER SUCT BULB TIP NO VENT (SUCTIONS) ×3 IMPLANT

## 2017-01-18 NOTE — Anesthesia Preprocedure Evaluation (Signed)
Anesthesia Evaluation  Patient identified by MRN, date of birth, ID band Patient awake    Reviewed: Allergy & Precautions, H&P , Patient's Chart, lab work & pertinent test results, reviewed documented beta blocker date and time   Airway Mallampati: II  TM Distance: >3 FB Neck ROM: full    Dental no notable dental hx.    Pulmonary sleep apnea ,    Pulmonary exam normal breath sounds clear to auscultation       Cardiovascular hypertension, On Medications  Rhythm:regular Rate:Normal     Neuro/Psych    GI/Hepatic   Endo/Other  Morbid obesity  Renal/GU      Musculoskeletal   Abdominal   Peds  Hematology   Anesthesia Other Findings   Reproductive/Obstetrics                             Anesthesia Physical Anesthesia Plan  ASA: III  Anesthesia Plan: General   Post-op Pain Management:    Induction: Intravenous  PONV Risk Score and Plan: 2 and Ondansetron, Dexamethasone and Propofol  Airway Management Planned: Oral ETT  Additional Equipment:   Intra-op Plan: Utilization of Controlled Hypotension per surrgeon request  Post-operative Plan: Extubation in OR  Informed Consent: I have reviewed the patients History and Physical, chart, labs and discussed the procedure including the risks, benefits and alternatives for the proposed anesthesia with the patient or authorized representative who has indicated his/her understanding and acceptance.   Dental Advisory Given  Plan Discussed with: CRNA and Surgeon  Anesthesia Plan Comments: (  )        Anesthesia Quick Evaluation

## 2017-01-18 NOTE — Brief Op Note (Signed)
01/18/2017  1:50 PM  PATIENT:  Seth Parker  31 y.o. male  PRE-OPERATIVE DIAGNOSIS:  HNP Stenosis L2-3  POST-OPERATIVE DIAGNOSIS:  HNP Stenosis L2-3  PROCEDURE:  Procedure(s) with comments: Microlumbar decompression L2-3 poss L1-2 (N/A) - 120 Mins  SURGEON:  Surgeon(s) and Role:    Susa Day, MD - Primary  PHYSICIAN ASSISTANT:   ASSISTANTS: Bissell   ANESTHESIA:   general  EBL:  Total I/O In: 1000 [I.V.:1000] Out: 400 [Urine:225; Blood:175]  BLOOD ADMINISTERED:none  DRAINS: none   LOCAL MEDICATIONS USED:  MARCAINE     SPECIMEN:  Source of Specimen:  L23  DISPOSITION OF SPECIMEN:  PATHOLOGY  COUNTS:  YES  TOURNIQUET:  * No tourniquets in log *  DICTATION: Viviann Spare Dictation [phone]  PLAN OF CARE: Admit for overnight observation  PATIENT DISPOSITION:  PACU - hemodynamically stable.   Delay start of Pharmacological VTE agent (>24hrs) due to surgical blood loss or risk of bleeding: yes

## 2017-01-18 NOTE — Anesthesia Procedure Notes (Signed)
Procedure Name: Intubation Date/Time: 01/18/2017 10:47 AM Performed by: Danley Danker L Patient Re-evaluated:Patient Re-evaluated prior to induction Oxygen Delivery Method: Circle system utilized Preoxygenation: Pre-oxygenation with 100% oxygen Induction Type: IV induction Ventilation: Mask ventilation without difficulty and Oral airway inserted - appropriate to patient size Laryngoscope Size: Miller and 3 Grade View: Grade II Tube type: Oral Tube size: 8.0 mm Number of attempts: 1 Airway Equipment and Method: Stylet Placement Confirmation: ETT inserted through vocal cords under direct vision,  positive ETCO2 and breath sounds checked- equal and bilateral Secured at: 22 cm Tube secured with: Tape Dental Injury: Teeth and Oropharynx as per pre-operative assessment

## 2017-01-18 NOTE — H&P (View-Only) (Signed)
GINA COSTILLA is an 31 y.o. male.   Chief Complaint: back and leg pain HPI: The patient is a 31 year old male who presents today for follow up of their back. The patient is being followed for their back pain. They are now 7 weeks and 3 days out from injury (DOI 11/22/16). Symptoms reported today include: pain. Current treatment includes: activity modification, pain medications (Oxycodone) and Valium. The following medication has been used for pain control: Oxycodone. The patient reports their current pain level to be mild (to severe). The patient presents today following MRI.  Eithan follows up after his trip to the emergency room. He had an episode apparently last week where there was a question whether he had some urinary incontinence. He called Dr. Netta Cedars who was on call for [place] who discussed this with me. He indicated that he was seen at the [facility] Emergency Room and they did a repeat MRI of his lumbar spine again showing the disc herniation at L2-3. He had apparently full workup, they did not feel he had a cauda equina syndrome with no retained fluid postvoid in his bladder. He still reports pain in both sides. His injury he had when he was at work and lifting resulted in pain down both legs. In the past, it was only down into the left. When he stands too long, coughs or sneeze, he develops bilateral leg pain. He currently today reports no numbness into his groin or calf and normal bowel and bladder function. He is here with his father.  He reports he is 315 pounds.  REVIEW OF SYSTEMS No fevers or chills.  Past Medical History:  Diagnosis Date  . Anxiety   . Depression   . Family history of premature CAD    a. in father with CAD s/p CABG in early 41s  . GERD (gastroesophageal reflux disease)   . Hyperlipidemia   . Hypertension   . Lumbar spinal stenosis    herniated discs  . Morbid obesity ([place])   . Pituitary adenoma ([place])   . Sleep apnea    uses cpap some nights set oon 7    . Spinal stenosis   . Tobacco abuse    a. dipping    Past Surgical History:  Procedure Laterality Date  . colonscopy  2-3 yrs ago  . EYE SURGERY Left    secondary to trauma as a child    Family History  Problem Relation Age of Onset  . Heart disease Father   . Heart attack Father 78  . Hypertension Father   . Hyperlipidemia Father   . Mental illness Father        bipolar  . Hypertension Mother   . Cancer Maternal Aunt        breast  . Cancer Maternal Uncle 50       colon  . Cancer Maternal Grandmother    Social History:  reports that he has never smoked. His smokeless tobacco use includes Chew. He reports that he drinks alcohol. He reports that he does not use drugs.  Allergies: No Known Allergies   (Not in a hospital admission)  No results found for this or any previous visit (from the past 48 hour(s)). No results found.  Review of Systems  Constitutional: Negative.   HENT: Negative.   Eyes: Negative.   Respiratory: Negative.   Cardiovascular: Negative.   Gastrointestinal: Negative.   Genitourinary: Negative.   Musculoskeletal: Positive for back pain.  Skin: Negative.  Neurological: Positive for sensory change and focal weakness.  Psychiatric/Behavioral: Negative.     There were no vitals taken for this visit. Physical Exam  Constitutional: He is oriented to person, place, and time. He appears well-developed. He appears distressed.  HENT:  Head: Normocephalic.  Eyes: Pupils are equal, round, and reactive to light.  Neck: Normal range of motion.  Cardiovascular: Normal rate.   Respiratory: Effort normal.  GI: Soft.  Musculoskeletal:  He is standing, in moderate distress. Mood and affect is appropriate. He has pain, he is unable to extend. He forward flexes with pain in his lower back.  He has slight hip flexor weakness bilaterally. Straight leg raise is with buttock pain. Slight hip flexor weakness on the left. Lumbar spine exam reveals no evidence  of soft tissue swelling, deformity or skin ecchymosis. On palpation there is no tenderness of the lumbar spine. No flank pain with percussion. The abdomen is soft and nontender. Nontender over the trochanters. No cellulitis or lymphadenopathy.  Good range of motion of the lumbar spine without associated pain. Motor is 5/5 including EHL, tibialis anterior, plantar flexion, quadriceps and hamstrings. Patient is normoreflexic. There is no Babinski or clonus. Sensory exam is intact to light touch. Patient has good distal pulses. No DVT. No pain and normal range of motion without instability of the hips, knees and ankles. He has normal peroneal sensation. Good motor function distally.  Inspection of the cervical spine reveals a normal lordosis without evidence of paraspinous spasms or soft tissue swelling. Nontender to palpation. Full flexion, full extension, full left and right lateral rotation. Extension combined with lateral flexion does not reproduce pain. Negative impingement sign, negative secondary impingement sign of the shoulders. Negative Tinel's median and ulnar nerves at the elbow. Negative carpal compression test at the wrist. Motor of the upper extremities is 5/5 including biceps, triceps, brachioradialis, wrist flexion, wrist extension, finger flexion, finger extension. Reflexes are normoreflexic. Sensory exam is intact to light touch. There is no Hoffmann sign. Nontender over the thoracic spine.  Neurological: He is alert and oriented to person, place, and time.  Skin: Skin is warm and dry.    His MRI here at the [facility] demonstrates a large disc herniation at L2-3 with severe stenosis at L2-3 centrally. He has a disc herniation at L3-4 producing only mild mass effect. 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  Assessment/Plan Bilateral radiculopathy secondary to a central disc herniation at L2-3. Episode of possible near incontinence a week ago  with the workup indicating in [place], he is currently out of work, unable to do that.  I had an extensive discussion with he and his father concerning his current pathology, relevant anatomy and treatment options. I agree given the presence of a neural compressive lesion at L2-3 that I would currently recommend at this point in time given his pain appears to be worsening that we proceed with a micro lumbar decompression at L2-3 centrally to relieve the compression of the thecal sac to prevent the progression neurologic deficit cauda equina etc. I do agree with the emergency room that we will keep him out of work at this point. We discussed favoring flexion, avoiding extension, avoiding valsalva maneuvers or lifting that my worsen his disc herniation and his neurologic situation. He has nodded he knows. He is also instructed on signs and symptoms of cauda equina, present to the emergency room emergently and if not attended within 24 hours it leads to permanent  neurologic deficit. I will expedite this decompression. He is 315 pounds. We discussed post decompression activity, weight reduction, etc. He has multilevel disc degeneration. We also discussed possibility of recurrent disc herniation, needing further surgery or fusion. I do not feel L3-4 needs to be decompressed at this point.  Gave him a prescription for Percocet to be taken twice a day as needed. Gentle stretching, stationary bike. Pool, work. I gave a note to be out of work as again given his recent experience and his MRI, I would recommend proceeding with a decompression.  Again, he had reported at the conclusion of treatment of his previous disc herniation that he had no symptoms and was released to work at full duties. His lifting injury generated symptoms bilaterally, not only left that he had prior, but into the right. Upon my view I feel that his MRI shows a slight progression of this L2-3 disc herniations. It suggests exacerbation of that  underlying condition. I would therefore suggest the need for surgical intervention secondary to his additional work injury. Discussed with he and his father. We will proceed ASAP. I had an extensive discussion of the risks and benefits of the lumbar decompression with the patient including bleeding, infection, damage to neurovascular structures, epidural fibrosis, CSF leak requiring repair. We also discussed increase in pain, adjacent segment disease, recurrent disc herniation, need for future surgery including repeat decompression and/or fusion. We also discussed risks of postoperative hematoma, paralysis, anesthetic complications including DVT, PE, death, cardiopulmonary dysfunction. In addition, the perioperative and postoperative courses were discussed in detail including the rehabilitative time and return to functional activity and work. I provided the patient with an illustrated handout and utilized the appropriate surgical models.  Plan microlumbar decompression L2-3, possible L1-2  Jeraldine Primeau, Conley Rolls., PA-C for Dr. Tonita Cong 01/17/2017, 4:56 PM

## 2017-01-18 NOTE — Transfer of Care (Signed)
Immediate Anesthesia Transfer of Care Note  Patient: Seth Parker  Procedure(s) Performed: Procedure(s) with comments: Microlumbar decompression L2-3 (N/A) - 120 Mins  Patient Location: PACU  Anesthesia Type:General  Level of Consciousness: awake  Airway & Oxygen Therapy: Patient Spontanous Breathing and Patient connected to face mask oxygen  Post-op Assessment: Report given to RN and Post -op Vital signs reviewed and stable  Post vital signs: Reviewed and stable  Last Vitals:  Vitals:   01/18/17 0826  BP: (!) 160/95  Pulse: 76  Resp: 18  Temp: 37.3 C    Last Pain:  Vitals:   01/18/17 0954  TempSrc:   PainSc: 0-No pain      Patients Stated Pain Goal: 4 (47/12/52 [id number])  Complications: No apparent anesthesia complications

## 2017-01-18 NOTE — Interval H&P Note (Signed)
History and Physical Interval Note:  01/18/2017 8:30 AM  Seth Parker  has presented today for surgery, with the diagnosis of HNP Stenosis L2-3  The various methods of treatment have been discussed with the patient and family. After consideration of risks, benefits and other options for treatment, the patient has consented to  Procedure(s) with comments: Microlumbar decompression L2-3 poss L1-2 (N/A) - 120 Mins as a surgical intervention .  The patient's history has been reviewed, patient examined, no change in status, stable for surgery.  I have reviewed the patient's chart and labs.  Questions were answered to the patient's satisfaction.     [name]

## 2017-01-18 NOTE — Anesthesia Postprocedure Evaluation (Signed)
Anesthesia Post Note  Patient: Seth Parker  Procedure(s) Performed: Procedure(s) (LRB): Microlumbar decompression L2-3 (N/A)     Patient location during evaluation: PACU Anesthesia Type: General Level of consciousness: awake and alert Pain management: pain level controlled Vital Signs Assessment: post-procedure vital signs reviewed and stable Respiratory status: spontaneous breathing, nonlabored ventilation, respiratory function stable and patient connected to nasal cannula oxygen Cardiovascular status: blood pressure returned to baseline and stable Postop Assessment: no signs of nausea or vomiting Anesthetic complications: no    Last Vitals:  Vitals:   01/18/17 1545 01/18/17 1600  BP: (!) 148/82   Pulse: 91 89  Resp: 20 16  Temp:      Last Pain:  Vitals:   01/18/17 1600  TempSrc:   PainSc: [name]

## 2017-01-18 NOTE — Discharge Instructions (Signed)

## 2017-01-19 DIAGNOSIS — S33121A Dislocation of L2/L3 lumbar vertebra, initial encounter: Secondary | ICD-10-CM | POA: Diagnosis not present

## 2017-01-19 NOTE — Op Note (Signed)
NAMEDENIZ, HANNAN NO.:  [id number]  MEDICAL RECORD NO.:  [id number]  LOCATION:  [facility]                         FACILITY:  [facility]  PHYSICIAN:  Susa Day, M.D.    DATE OF BIRTH:  October 14, 1985  DATE OF PROCEDURE:  01/19/2017 DATE OF DISCHARGE:                              OPERATIVE REPORT   PREOPERATIVE DIAGNOSIS: 1. Severe spinal stenosis at L2-3. 2. Morbid obesity, body mass index is 46.  POSTOPERATIVE DIAGNOSES: 1. Severe spinal stenosis at L2-3. 2. Morbid obesity, body mass index is 46.  PROCEDURE PERFORMED: 1. Bilateral microlumbar decompression at L2-3 with bilateral L3     foraminotomy. 2. Bilateral microdiskectomy at L2-3.  ANESTHESIA:  General.  ASSISTANT:  Cleophas Dunker, PA.  PATHOLOGY:  Disk herniated material to Pathology.  HISTORY:  A 31 year old male, injured himself at work, he had a central disk herniation at L2-3 with severe resulted spinal stenosis, bilateral neurogenic claudication secondary to spinal stenosis, weakness.  Due to the pain and severity of the symptoms, we discussed microlumbar decompression at L2-3, possible L1-2.  He had multilevel disk degeneration, including L3-4, L4-5, and L5-S1 and morbid obesity.  He had failed conservative treatment.  He had 1 episode of potential urinary incontinence, was worked up, evaluated, and ruled out.  We discussed the decompression without fusion, but the possibility of requiring a fusion in the future as well as limitations secondary to his multilevel disk degeneration and a possibility of recurrent disk herniation, and the need for weight reduction.  TECHNIQUE:  With the patient in supine position, which required additional help for due to the patient's elevated BMI.  His lumbar region was prepped and draped in usual sterile fashion.  He had voided prior to the procedure.  Three 18-gauge spinal needles were utilized to localize L2-3 interspace confirmed by x-ray.  An incision  was made from above the spinous process of L2 to the mid portion of L3.  Subcutaneous tissue was dissected.  Electrocautery was utilized to achieve hemostasis.  Marcaine 0.25% with epinephrine was infiltrated perimuscular tissue for anesthesia and hemostasis. The dorsolumbar fascia was divided in line with skin incision.  We elevated the musculature from the lamina of L2 and L3.  Bipolar electrocautery was utilized to achieve hemostasis as well as thrombin-soaked Gelfoam and bone wax.  McCulloch retractor was placed.  Confirmatory radiograph obtained with a Kocher on the spinous process of L2.  Given the disk herniation in the interlaminar space below the level of the disk herniation and the severity of the stenosis, we proceeded with decompressing above and below the disk space.  I therefore removed the spinous process of L2 with Leksell rongeur, and then proceeded with bilateral hemilaminotomy for centrally at L2 to above the level of the insertion of the ligamentum flavum.  Then, detached ligamentum flavum from the cephalad edge of L3 utilizing a micro curette.  There initially was extrusion of epidural fat through the first opening of the ligamentum flavum.  I meticulously then proceeded with removing the ligamentum flavum bilaterally without compressing the thecal sac.  On top of the ligamentum, we decompressed the lateral recesses to the medial border of the pedicle and then  with a Woodson retractor meticulously from both sides of the operating room table.  We removed the ligamentum first centrally and then laterally to the medial border of the pedicle bilaterally.  Severe stenosis was noted bilaterally and with the thecal sac.  Also performed bilateral hemilaminotomy and foraminotomy of L3 to allow mobilization on either side of the thecal sac for the decompression.  Once this had occurred, we obtained an additional radiograph with a Penfield at the disk space.  First on  the left, we gently mobilized thecal sac medially, identified a herniated disk at L3 root and the L2 root.  Bipolar electrocautery was utilized to achieve hemostasis.  There was extensive epidural venous plexus. Identified the rupture, I performed an annulotomy.  Copious portion of disk material was removed the disk space with a micro pituitary, further mobilized with a micro nerve hook and micropituitary.  Irrigated the disk space with catheter lavage and retrieved additional fragments. This mobilized and decompressed the left side of the thecal sac and nerve roots.  We then turned our attention to the right in a similar fashion.  We mobilized the lateral aspect of the thecal sac and the L3 root.  Identifying disk space, again having to cauterize epidural venous plexus, we used thrombin-soaked Gelfoam for hemostasis as well. Performed an annulotomy here as disk herniation was noted on the right as well and removed additional fragments from the right irrigating with catheter lavage and then additional fragments following that.  Following this, there was a complete restoration of the thecal sac.  Good mobility and neural probe passed freely up the foramen of L3 and L2 bilaterally and above the pedicle of L2 and below the pedicle of L3.  We decompressed the stenosis corresponding to the preoperative lesion. Again, we irrigated disk space, we inspected both sides and no evidence of residual disk herniation, epidural bleeding, etc.  We spent suitable time developing the plane between the pedicle and the thecal sac.  There were multiple adhesions particularly from the lamina of L3 due to the length of the stenosis and its duration, but again, no evidence of CSF leakage or bleeding following this decompression.  We had used the extra long McCullough retractors to the lamina as well as to the ample subcutaneous adipose tissue adding to the operative time.  Next, after copious irrigation,  thrombin-soaked Gelfoam was placed gently over the laminotomy defect.  We removed the [facility] retractor, paraspinous muscles were irrigated and inspected, no evidence of active bleeding.  We closed the dorsal lumbar fascia with #1 Vicryl interrupted figure-of-eight sutures, subcu with multiple layers of 2-0, and the skin with staples.  Wound was dressed sterilely.  Placed supine on the hospital bed, extubated without difficulty, and transported to the recovery room in satisfactory condition.  The patient tolerated the procedure well.  No complications.  Assistant, Cleophas Dunker, PA, was used throughout the case for patient positioning, gentle intermittent neural traction, suction, and closure.  Blood loss 25 mL.  Again, technical difficulty increased due to the patient's elevated BMI.    Susa Day, M.D.    Geralynn Rile  D:  01/19/2017  T:  01/19/2017  Job:  [id number]

## 2017-01-19 NOTE — Progress Notes (Signed)
Physical Therapy Treatment Patient Details Name: Seth Parker MRN: [id number] DOB: 01-Apr-1986 Today's Date: 01/19/2017    History of Present Illness Pt is a 31 y.o. male s/p Microlumbar decompression L2-3. Anxiety, Depression, GERD, Hyperlipidemia, HTN, Obesity.     PT Comments     Improved mobility and decreased complaint of pain.  May benefit from HHPT for mobility and progression.   Follow Up Recommendations  Home health PT (may benefit for  continued education and progression)     Equipment Recommendations  Rolling walker with 5" wheels;3in1 (PT)    Recommendations for Other Services       Precautions / Restrictions Precautions Precautions: Back;Fall Precaution Booklet Issued: Yes (comment) Precaution Comments: Educated pt on back precautions    Mobility  Bed Mobility Overal bed mobility: Needs Assistance Bed Mobility: Sit to Sidelying;Sit to Supine Rolling: Min assist Sidelying to sit: Min guard   Sit to supine: Mod assist   General bed mobility comments: attempted to go to right elbow and side, difficult due to body habitus. Demonstrated use of RW beside bed for rail. assited legs onto bed, patient braced self with Upper body. and lowered self to supine  Transfers Overall transfer level: Needs assistance Equipment used: Rolling walker (2 wheeled) Transfers: Sit to/from Stand Sit to Stand: Min guard         General transfer comment: 1 hand on  RW and push from recliner w/ 1.  bed raised for sitting down  Ambulation/Gait Ambulation/Gait assistance: Min guard Ambulation Distance (Feet): 60 Feet Assistive device: Rolling walker (2 wheeled) Gait Pattern/deviations: Step-through pattern     General Gait Details: moving much better and faster, less reliance on RW.   Stairs            Wheelchair Mobility    Modified Rankin (Stroke Patients Only)       Balance                                            Cognition  Arousal/Alertness: Awake/alert Behavior During Therapy: WFL for tasks assessed/performed Overall Cognitive Status: Within Functional Limits for tasks assessed                                        Exercises      General Comments        Pertinent Vitals/Pain Pain Score: 6  Pain Location: back Pain Descriptors / Indicators: Aching;Sharp;Sore;Grimacing Pain Intervention(s): Repositioned;Premedicated before session    Home Living Family/patient expects to be discharged to:: Private residence Living Arrangements: Parent Available Help at Discharge: Family Type of Home: Mobile home Home Access: Stairs to enter Entrance Stairs-Rails: Can reach both Home Layout: One level Home Equipment: None Additional Comments: Has a lift chair    Prior Function Level of Independence: Independent      Comments: works as an EMT   PT Goals (current goals can now be found in the care plan section) Acute Rehab PT Goals Patient Stated Goal: decrease pain, go back to work PT Goal Formulation: With patient/family Time For Goal Achievement: 01/26/17 Potential to Achieve Goals: Good Progress towards PT goals: Progressing toward goals    Frequency    Min 5X/week      PT Plan Discharge plan needs to be updated    Co-evaluation  AM-PAC PT "6 Clicks" Daily Activity  Outcome Measure  Difficulty turning over in bed (including adjusting bedclothes, sheets and blankets)?: Total Difficulty moving from lying on back to sitting on the side of the bed? : Total Difficulty sitting down on and standing up from a chair with arms (e.g., wheelchair, bedside commode, etc,.)?: Total Help needed moving to and from a bed to chair (including a wheelchair)?: Total Help needed walking in hospital room?: Total Help needed climbing 3-5 steps with a railing? : Total 6 Click Score: 6    End of Session   Activity Tolerance: Patient tolerated treatment well Patient left: in  bed;with call bell/phone within reach;with family/visitor present Nurse Communication: Mobility status PT Visit Diagnosis: Difficulty in walking, not elsewhere classified (R26.2)     Time: 7357-8978 PT Time Calculation (min) (ACUTE ONLY): 33 min  Charges:  $Gait Training: 23-37 mins                    G Codes:  Functional Assessment Tool Used: Clinical judgement Functional Limitation: Mobility: Walking and moving around Mobility: Walking and Moving Around Current Status (E7841): At least 40 percent but less than 60 percent impaired, limited or restricted Mobility: Walking and Moving Around Goal Status [phone]): At least 1 percent but less than 20 percent impaired, limited or restricted    [facility] PT [phone]    Claretha Cooper 01/19/2017, 3:53 PM

## 2017-01-19 NOTE — Progress Notes (Signed)
Contacted WC Arby Barrette at [phone] clm # [id number] to arrange DME. She referred me to [employer] at [phone]. Called them, provided them with clinicals and orders via fax to [phone] ID # [id number]. They require a 24-48 delivery, asked them to deliver to patient's home.

## 2017-01-19 NOTE — Progress Notes (Signed)
Subjective: 1 Day Post-Op Procedure(s) (LRB): Microlumbar decompression L2-3 (N/A) Patient reports pain as 4 on 0-10 scale.   Voiding well. Legs feels much better No numbness. Objective: Vital signs in last 24 hours: Temp:  [97.5 F (36.4 C)-99.2 F (37.3 C)] 98.6 F (37 C) (07/19 0521) Pulse Rate:  [76-102] 91 (07/19 0521) Resp:  [13-21] 17 (07/19 0521) BP: (126-171)/(68-106) 142/78 (07/19 0521) SpO2:  [95 %-100 %] 98 % (07/19 0521) Weight:  [141.1 kg (311 lb)] 141.1 kg (311 lb) (07/18 1615)  Intake/Output from previous day: 07/18 0701 - 07/19 0700 In: 3508.3 [P.O.:960; I.V.:2248.3; IV Piggyback:300] Out: 2250 [Urine:2075; Blood:175] Intake/Output this shift: Total I/O In: 1328.3 [P.O.:480; I.V.:648.3; IV Piggyback:200] Out: 1350 [Urine:1350]  No results for input(s): HGB in the last 72 hours. No results for input(s): WBC, RBC, HCT, PLT in the last 72 hours. No results for input(s): NA, K, CL, CO2, BUN, CREATININE, GLUCOSE, CALCIUM in the last 72 hours. No results for input(s): LABPT, INR in the last 72 hours.  Neurologically intact Sensation intact distally Intact pulses distally Dorsiflexion/Plantar flexion intact Incision: dressing C/D/I No DVT Assessment/Plan: 1 Day Post-Op Procedure(s) (LRB): Microlumbar decompression L2-3 (N/A)  Instructions given . Surgery discussed. Advance diet Up with therapy D/C IV fluids Discharge home with home health  Keiffer Piper C 01/19/2017, 7:00 AM

## 2017-01-19 NOTE — Progress Notes (Signed)
Pt prefers self placement of CPAP.  RT to monitor and assess as needed.  

## 2017-01-19 NOTE — Evaluation (Signed)
Occupational Therapy Evaluation Patient Details Name: HEWITT GARNER MRN: [id number] DOB: [birth year] Today's Date: 01/19/2017    History of Present Illness Pt is a 31 y.o. male s/p Microlumbar decompression L2-3. Anxiety, Depression, GERD, Hyperlipidemia, HTN, Obesity.    Clinical Impression   Pt reports he was independent with ADL PTA and was working as an Public relations account executive. Currently pt requiring min assist for sit to stand with use of RW for balance due to increased pain with movement. Pt requires max assist for LB ADL. Began back, safety, and ADL education with pt but he was limited with mobility this AM due to pain (RN notified-pt requesting pain meds). Pt planning to d/c home with supervision from family. Pt would benefit from continued skilled OT to address established goals.    Follow Up Recommendations  No OT follow up;Supervision/Assistance - 24 hour    Equipment Recommendations  3 in 1 bedside commode (tub bench pending progress)    Recommendations for Other Services PT consult     Precautions / Restrictions Precautions Precautions: Back;Fall Precaution Booklet Issued: No Precaution Comments: Educated pt on back precautions Restrictions Weight Bearing Restrictions: No      Mobility Bed Mobility Overal bed mobility: Needs Assistance Bed Mobility: Rolling;Sidelying to Sit Rolling: Min guard Sidelying to sit: Min guard       General bed mobility comments: HOB slightly elevated with use of bed rail. Cues for technique throughout  Transfers Overall transfer level: Needs assistance Equipment used: Rolling walker (2 wheeled) Transfers: Sit to/from Stand Sit to Stand: Min assist;From elevated surface         General transfer comment: Min assist to boost up from EOB. Cues for hand placement with RW. Bed elevated for additional assist    Balance Overall balance assessment: Needs assistance Sitting-balance support: Feet supported;Single extremity supported Sitting  balance-Leahy Scale: Fair     Standing balance support: Bilateral upper extremity supported Standing balance-Leahy Scale: Poor Standing balance comment: RW for support in standing                           ADL either performed or assessed with clinical judgement   ADL Overall ADL's : Needs assistance/impaired Eating/Feeding: Set up;Sitting   Grooming: Set up;Sitting   Upper Body Bathing: Min guard;Sitting   Lower Body Bathing: Maximal assistance;Sit to/from stand   Upper Body Dressing : Min guard;Sitting   Lower Body Dressing: Maximal assistance;Sit to/from stand Lower Body Dressing Details (indicate cue type and reason): Pt unable to cross foot over opposite knee. Max assist to don socks in sitting   Toilet Transfer Details (indicate cue type and reason): Pt able to use urinal sitting EOB           General ADL Comments: Pt able to perform sit to stand at EOB with use of RW and min physical assist provided to boost up. Limited mobility performed due to pain     Vision         Perception     Praxis      Pertinent Vitals/Pain Pain Assessment: 0-10 Pain Score: 10-Worst pain ever Pain Location: back Pain Descriptors / Indicators: Aching;Sharp;Sore;Grimacing Pain Intervention(s): Monitored during session;Limited activity within patient's tolerance;Repositioned;Patient requesting pain meds-RN notified     Hand Dominance     Extremity/Trunk Assessment Upper Extremity Assessment Upper Extremity Assessment: Overall WFL for tasks assessed   Lower Extremity Assessment Lower Extremity Assessment: Defer to PT evaluation   Cervical / Trunk Assessment  Cervical / Trunk Assessment: Other exceptions Cervical / Trunk Exceptions: s/p spinal sx   Communication Communication Communication: No difficulties   Cognition Arousal/Alertness: Awake/alert Behavior During Therapy: WFL for tasks assessed/performed Overall Cognitive Status: Within Functional Limits for  tasks assessed                                     General Comments       Exercises     Shoulder Instructions      Home Living Family/patient expects to be discharged to:: Private residence Living Arrangements: Parent Available Help at Discharge: Family Type of Home: Mobile home Home Access: Stairs to enter Technical brewer of Steps: 5 Entrance Stairs-Rails: Can reach both Home Layout: One level     Bathroom Shower/Tub: Corporate investment banker: Handicapped height     [place]: None          Prior Functioning/Environment Level of Independence: Independent        Comments: works as an Tour manager Problem List: Decreased strength;Decreased activity tolerance;Impaired balance (sitting and/or standing);Decreased knowledge of use of DME or AE;Decreased knowledge of precautions;Obesity;Pain      OT Treatment/Interventions: Self-care/ADL training;Energy conservation;DME and/or AE instruction;Therapeutic activities;Patient/family education;Balance training    OT Goals(Current goals can be found in the care plan section) Acute Rehab OT Goals Patient Stated Goal: decrease pain OT Goal Formulation: With patient Time For Goal Achievement: 02/02/17 Potential to Achieve Goals: Good ADL Goals Pt Will Perform Lower Body Bathing: with supervision;sit to/from stand Pt Will Perform Lower Body Dressing: with supervision;sit to/from stand Pt Will Transfer to Toilet: with supervision;ambulating;bedside commode (over toilet) Pt Will Perform Toileting - Clothing Manipulation and hygiene: with supervision;sit to/from stand Pt Will Perform Tub/Shower Transfer: Tub transfer;with supervision;ambulating (3 in 1 vs. tub bench) Additional ADL Goal #1: Pt will independently verbally refcall 3/3 back precautions and maintain throughout ADL.  OT Frequency: Min 2X/week   Barriers to D/C:            Co-evaluation              AM-PAC  PT "6 Clicks" Daily Activity     Outcome Measure Help from another person eating meals?: None Help from another person taking care of personal grooming?: A Little Help from another person toileting, which includes using toliet, bedpan, or urinal?: A Little Help from another person bathing (including washing, rinsing, drying)?: A Lot Help from another person to put on and taking off regular upper body clothing?: A Little Help from another person to put on and taking off regular lower body clothing?: A Lot 6 Click Score: 17   End of Session Equipment Utilized During Treatment: Rolling walker Nurse Communication: Mobility status;Patient requests pain meds  Activity Tolerance: Patient limited by pain Patient left: with call bell/phone within reach;Other (comment) (sitting EOB)  OT Visit Diagnosis: Unsteadiness on feet (R26.81);Other abnormalities of gait and mobility (R26.89);Pain Pain - part of body:  (back)                Time: 6789-3810 OT Time Calculation (min): 19 min Charges:  OT General Charges $OT Visit: 1 Procedure OT Evaluation $OT Eval Moderate Complexity: 1 Procedure G-Codes: OT G-codes **NOT FOR INPATIENT CLASS** Functional Assessment Tool Used: AM-PAC 6 Clicks Daily Activity Functional Limitation: Self care Self Care Current Status (F7510): At least 40 percent but less than  60 percent impaired, limited or restricted Self Care Goal Status (C0919): At least 1 percent but less than 20 percent impaired, limited or restricted   Mel Almond A. Ulice Brilliant, M.S., OTR/L Pager: [place] 01/19/2017, 8:36 AM

## 2017-01-19 NOTE — Evaluation (Signed)
Physical Therapy Evaluation Patient Details Name: Seth Parker MRN: [id number] DOB: [birth year] Today's Date: 01/19/2017   History of Present Illness  Pt is a 31 y.o. male s/p Microlumbar decompression L2-3. Anxiety, Depression, GERD, Hyperlipidemia, HTN, Obesity.   Clinical Impression  The patient  Requires assistance for bed mobility. Ambulated x 50' with RW. The patient reports standing  And ambulating is comfortable. Pt admitted with above diagnosis. Pt currently with functional limitations due to the deficits listed below (see PT Problem List).  Pt will benefit from skilled PT to increase their independence and safety with mobility to allow discharge to the venue listed below.       Follow Up Recommendations No PT follow up    Equipment Recommendations  Rolling walker with 5" wheels;3in1 (PT)    Recommendations for Other Services       Precautions / Restrictions Precautions Precautions: Back;Fall Precaution Booklet Issued: Yes (comment) Precaution Comments: Educated pt on back precautions      Mobility  Bed Mobility Overal bed mobility: Needs Assistance Bed Mobility: Rolling;Sidelying to Sit Rolling: Min assist Sidelying to sit: Min guard       General bed mobility comments: HOB slightly elevated with use of bed rail. Cues for technique throughout  Transfers Overall transfer level: Needs assistance Equipment used: Rolling walker (2 wheeled) Transfers: Sit to/from Stand Sit to Stand: Min assist;From elevated surface         General transfer comment: bed elevated to assist standing. Chair built up to sit down with arm rests  Ambulation/Gait Ambulation/Gait assistance: Min assist Ambulation Distance (Feet): 50 Feet Assistive device: Rolling walker (2 wheeled) Gait Pattern/deviations: Step-to pattern;Step-through pattern     General Gait Details: Initially stop and go steps, gradually able to continue with smoothe  ambulation. stopped for stand rest break  after 25'  Stairs            Wheelchair Mobility    Modified Rankin (Stroke Patients Only)       Balance                                             Pertinent Vitals/Pain Pain Score: 10-Worst pain ever Pain Location: back Pain Descriptors / Indicators: Aching;Sharp;Sore;Grimacing Pain Intervention(s): Monitored during session;Premedicated before session;Repositioned    Home Living Family/patient expects to be discharged to:: Private residence Living Arrangements: Parent Available Help at Discharge: Family Type of Home: Mobile home Home Access: Stairs to enter Entrance Stairs-Rails: Can reach both Entrance Stairs-Number of Steps: 5 Home Layout: One level Home Equipment: None Additional Comments: Has a lift chair    Prior Function Level of Independence: Independent         Comments: works as an Games developer        Extremity/Trunk Assessment   Upper Extremity Assessment Upper Extremity Assessment: Defer to OT evaluation    Lower Extremity Assessment Lower Extremity Assessment: Overall WFL for tasks assessed    Cervical / Trunk Assessment Cervical / Trunk Assessment: Other exceptions Cervical / Trunk Exceptions: s/p spinal sx  Communication   Communication: No difficulties  Cognition Arousal/Alertness: Awake/alert Behavior During Therapy: WFL for tasks assessed/performed Overall Cognitive Status: Within Functional Limits for tasks assessed  General Comments      Exercises     Assessment/Plan    PT Assessment Patient needs continued PT services  PT Problem List Decreased strength;Decreased range of motion;Decreased activity tolerance;Decreased mobility;Decreased knowledge of precautions;Decreased safety awareness;Decreased knowledge of use of DME;Pain       PT Treatment Interventions DME instruction;Gait training;Stair training;Functional mobility  training;Therapeutic activities;Patient/family education    PT Goals (Current goals can be found in the Care Plan section)  Acute Rehab PT Goals Patient Stated Goal: decrease pain, go back to work PT Goal Formulation: With patient/family Time For Goal Achievement: 01/26/17 Potential to Achieve Goals: Good    Frequency Min 5X/week   Barriers to discharge        Co-evaluation               AM-PAC PT "6 Clicks" Daily Activity  Outcome Measure Difficulty turning over in bed (including adjusting bedclothes, sheets and blankets)?: Total Difficulty moving from lying on back to sitting on the side of the bed? : Total Difficulty sitting down on and standing up from a chair with arms (e.g., wheelchair, bedside commode, etc,.)?: Total Help needed moving to and from a bed to chair (including a wheelchair)?: Total Help needed walking in hospital room?: Total Help needed climbing 3-5 steps with a railing? : Total 6 Click Score: 6    End of Session   Activity Tolerance: Patient tolerated treatment well Patient left: in chair;with call bell/phone within reach Nurse Communication: Mobility status PT Visit Diagnosis: Difficulty in walking, not elsewhere classified (R26.2)    Time: 3382-5053 PT Time Calculation (min) (ACUTE ONLY): 32 min   Charges:   PT Evaluation $PT Eval Low Complexity: 1 Procedure PT Treatments $Gait Training: 8-22 mins   PT G Codes:   PT G-Codes **NOT FOR INPATIENT CLASS** Functional Assessment Tool Used: Clinical judgement Functional Limitation: Mobility: Walking and moving around Mobility: Walking and Moving Around Current Status ([id number]): At least 40 percent but less than 60 percent impaired, limited or restricted Mobility: Walking and Moving Around Goal Status [phone]): At least 1 percent but less than 20 percent impaired, limited or restricted    [facility] PT [phone]   Claretha Cooper 01/19/2017, 1:01 PM

## 2017-01-20 DIAGNOSIS — S33121A Dislocation of L2/L3 lumbar vertebra, initial encounter: Secondary | ICD-10-CM | POA: Diagnosis not present

## 2017-01-20 NOTE — Progress Notes (Signed)
Physical Therapy Treatment Patient Details Name: Seth Parker MRN: [id number] DOB: October 31, 1985 Today's Date: 01/20/2017    History of Present Illness Pt is a 31 y.o. male s/p Microlumbar decompression L2-3. Anxiety, Depression, GERD, Hyperlipidemia, HTN, Obesity.     PT Comments    The patient is progressing well. Ready for Dc.   Follow Up Recommendations  Home health PT     Equipment Recommendations  Rolling walker with 5" wheels;3in1 (PT)    Recommendations for Other Services       Precautions / Restrictions Precautions Precautions: Back;Fall Precaution Booklet Issued: No Precaution Comments: Pt able to recall 2/3 back precautions Restrictions Weight Bearing Restrictions: No    Mobility  Bed Mobility Overal bed mobility: Needs Assistance Bed Mobility: Rolling;Sidelying to Sit Rolling: Min guard Sidelying to sit: Min guard       General bed mobility comments: in recliner  Transfers Overall transfer level: Needs assistance Equipment used: Rolling walker (2 wheeled) Transfers: Sit to/from Stand Sit to Stand: Min guard         General transfer comment: extra time to slide forward and position self to stand, uses rw  Ambulation/Gait Ambulation/Gait assistance: Min guard Ambulation Distance (Feet): 60 Feet Assistive device: Rolling walker (2 wheeled) Gait Pattern/deviations: Step-to pattern;Step-through pattern     General Gait Details: moving much better and faster, less reliance on RW.   Stairs Stairs: Yes   Stair Management: Step to pattern;Forwards;One rail Right;One rail Left Number of Stairs: 2    Wheelchair Mobility    Modified Rankin (Stroke Patients Only)       Balance Overall balance assessment: Needs assistance Sitting-balance support: Feet supported;No upper extremity supported Sitting balance-Leahy Scale: Fair     Standing balance support: No upper extremity supported;During functional activity Standing balance-Leahy Scale:  Fair                              Cognition Arousal/Alertness: Awake/alert Behavior During Therapy: WFL for tasks assessed/performed Overall Cognitive Status: Within Functional Limits for tasks assessed                                        Exercises      General Comments        Pertinent Vitals/Pain Pain Assessment: Faces Pain Score: 2  Faces Pain Scale: Hurts little more Pain Location: back Pain Descriptors / Indicators: Grimacing;Aching Pain Intervention(s): Premedicated before session;Patient requesting pain meds-RN notified    Home Living                      Prior Function            PT Goals (current goals can now be found in the care plan section) Acute Rehab PT Goals Patient Stated Goal: decrease pain, go back to work Progress towards PT goals: Progressing toward goals    Frequency    Min 5X/week      PT Plan Current plan remains appropriate    Co-evaluation              AM-PAC PT "6 Clicks" Daily Activity  Outcome Measure  Difficulty turning over in bed (including adjusting bedclothes, sheets and blankets)?: Total Difficulty moving from lying on back to sitting on the side of the bed? : Total Difficulty sitting down on and standing up from a chair  with arms (e.g., wheelchair, bedside commode, etc,.)?: Total Help needed moving to and from a bed to chair (including a wheelchair)?: A Little Help needed walking in hospital room?: A Little Help needed climbing 3-5 steps with a railing? : A Little 6 Click Score: 12    End of Session   Activity Tolerance: Patient tolerated treatment well Patient left: in chair;with call bell/phone within reach Nurse Communication: Mobility status PT Visit Diagnosis: Difficulty in walking, not elsewhere classified (R26.2)     Time: 3013-1438 PT Time Calculation (min) (ACUTE ONLY): 23 min  Charges:  $Gait Training: 23-37 mins                    G Codes:        {   Claretha Cooper 01/20/2017, 12:09 PM

## 2017-01-20 NOTE — Progress Notes (Signed)
Occupational Therapy Treatment Patient Details Name: Seth Parker MRN: [id number] DOB: [birth year] Today's Date: 01/20/2017    History of present illness Pt is a 31 y.o. male s/p Microlumbar decompression L2-3. Anxiety, Depression, GERD, Hyperlipidemia, HTN, Obesity.    OT comments  Pt with improvements in mobility today; able to perform toilet transfer with functional mobility to the bathroom with min guard assist. Educated pt on use of AE for increased independence with LB ADL; pt able to return demo use of AE. Reviewed back precautions in relation to functional activities. D/c plan remains appropriate. Will continue to follow acutely.   Follow Up Recommendations  No OT follow up;Supervision/Assistance - 24 hour    Equipment Recommendations  3 in 1 bedside commode    Recommendations for Other Services      Precautions / Restrictions Precautions Precautions: Back;Fall Precaution Booklet Issued: No Precaution Comments: Pt able to recall 2/3 back precautions Restrictions Weight Bearing Restrictions: No       Mobility Bed Mobility Overal bed mobility: Needs Assistance Bed Mobility: Rolling;Sidelying to Sit Rolling: Min guard Sidelying to sit: Min guard       General bed mobility comments: Good log roll technique, no physical assist needed. Increased time required  Transfers Overall transfer level: Needs assistance Equipment used: Rolling walker (2 wheeled) Transfers: Sit to/from Stand Sit to Stand: Min guard         General transfer comment: cues for hand placement    Balance Overall balance assessment: Needs assistance Sitting-balance support: Feet supported;No upper extremity supported Sitting balance-Leahy Scale: Fair     Standing balance support: No upper extremity supported;During functional activity Standing balance-Leahy Scale: Fair                             ADL either performed or assessed with clinical judgement   ADL Overall ADL's  : Needs assistance/impaired               Lower Body Bathing Details (indicate cue type and reason): Educated on use of long handled sponge Upper Body Dressing : Set up;Sitting   Lower Body Dressing: Minimal assistance;Sit to/from stand;With adaptive equipment Lower Body Dressing Details (indicate cue type and reason): Educated pt on use of AE for increased independence with LB ADL; pt able to return demo use Toilet Transfer: RW;Min guard;Ambulation   Toileting- Clothing Manipulation and Hygiene: Supervision/safety       Functional mobility during ADLs: Min guard;Rolling walker       Vision       Perception     Praxis      Cognition Arousal/Alertness: Awake/alert Behavior During Therapy: WFL for tasks assessed/performed Overall Cognitive Status: Within Functional Limits for tasks assessed                                          Exercises     Shoulder Instructions       General Comments      Pertinent Vitals/ Pain       Pain Assessment: Faces Pain Score: 2  Faces Pain Scale: Hurts little more Pain Location: back Pain Descriptors / Indicators: Grimacing;Aching Pain Intervention(s): Monitored during session;Repositioned;Premedicated before session  Home Living  Prior Functioning/Environment              Frequency  Min 2X/week        Progress Toward Goals  OT Goals(current goals can now be found in the care plan section)  Progress towards OT goals: Progressing toward goals  Acute Rehab OT Goals Patient Stated Goal: decrease pain, go back to work OT Goal Formulation: With patient  Plan Discharge plan remains appropriate    Co-evaluation                 AM-PAC PT "6 Clicks" Daily Activity     Outcome Measure   Help from another person eating meals?: None Help from another person taking care of personal grooming?: None Help from another person toileting,  which includes using toliet, bedpan, or urinal?: A Little Help from another person bathing (including washing, rinsing, drying)?: A Little Help from another person to put on and taking off regular upper body clothing?: None Help from another person to put on and taking off regular lower body clothing?: A Little 6 Click Score: 21    End of Session Equipment Utilized During Treatment: Rolling walker  OT Visit Diagnosis: Unsteadiness on feet (R26.81);Other abnormalities of gait and mobility (R26.89);Pain Pain - part of body:  (back)   Activity Tolerance Patient tolerated treatment well   Patient Left in chair;with call bell/phone within reach   Nurse Communication          Time: 7412-8786 OT Time Calculation (min): 23 min  Charges: OT General Charges $OT Visit: 1 Procedure OT Treatments $Self Care/Home Management : 23-37 mins  Seth Parker A. Ulice Brilliant, M.S., OTR/L Pager: [place] 01/20/2017, 10:13 AM

## 2017-01-20 NOTE — Discharge Summary (Signed)
Physician Discharge Summary   Patient ID: Seth Parker MRN: [id number] DOB/AGE: January 13, 1986 31 y.o.  Admit date: 01/18/2017 Discharge date: 01/20/2017  Primary Diagnosis:   HNP Stenosis L2-3  Admission Diagnoses:  Past Medical History:  Diagnosis Date  . Anxiety   . Depression   . Family history of premature CAD    a. in father with CAD s/p CABG in early 61s  . GERD (gastroesophageal reflux disease)   . Hyperlipidemia   . Hypertension   . Lumbar spinal stenosis    herniated discs  . Morbid obesity ([place])   . Pituitary adenoma ([place])   . Sleep apnea    uses cpap some nights set oon 7   . Spinal stenosis   . Tobacco abuse    a. dipping   Discharge Diagnoses:   Principal Problem:   HNP (herniated nucleus pulposus), lumbar  Procedure:  Procedure(s) (LRB): Microlumbar decompression L2-3 (N/A)   Consults: None  HPI:  see H&P    Laboratory Data: Admission on 01/10/2017, Discharged on 01/10/2017  Component Date Value Ref Range Status  . WBC 01/10/2017 9.7  3.8 - 10.6 K/uL Final  . RBC 01/10/2017 5.09  4.40 - 5.90 MIL/uL Final  . Hemoglobin 01/10/2017 15.1  13.0 - 18.0 g/dL Final  . HCT 01/10/2017 43.5  40.0 - 52.0 % Final  . MCV 01/10/2017 85.5  80.0 - 100.0 fL Final  . MCH 01/10/2017 29.8  26.0 - 34.0 pg Final  . MCHC 01/10/2017 34.8  32.0 - 36.0 g/dL Final  . RDW 01/10/2017 12.7  11.5 - 14.5 % Final  . Platelets 01/10/2017 243  150 - 440 K/uL Final  . Neutrophils Relative % 01/10/2017 59  % Final  . Neutro Abs 01/10/2017 5.8  1.4 - 6.5 K/uL Final  . Lymphocytes Relative 01/10/2017 30  % Final  . Lymphs Abs 01/10/2017 2.9  1.0 - 3.6 K/uL Final  . Monocytes Relative 01/10/2017 8  % Final  . Monocytes Absolute 01/10/2017 0.7  0.2 - 1.0 K/uL Final  . Eosinophils Relative 01/10/2017 2  % Final  . Eosinophils Absolute 01/10/2017 0.2  0 - 0.7 K/uL Final  . Basophils Relative 01/10/2017 1  % Final  . Basophils Absolute 01/10/2017 0.1  0 - 0.1 K/uL Final  . Sodium  01/10/2017 139  135 - 145 mmol/L Final  . Potassium 01/10/2017 3.5  3.5 - 5.1 mmol/L Final  . Chloride 01/10/2017 106  101 - 111 mmol/L Final  . CO2 01/10/2017 26  22 - 32 mmol/L Final  . Glucose, Bld 01/10/2017 103* 65 - 99 mg/dL Final  . BUN 01/10/2017 9  6 - 20 mg/dL Final  . Creatinine, Ser 01/10/2017 0.91  0.61 - 1.24 mg/dL Final  . Calcium 01/10/2017 9.0  8.9 - 10.3 mg/dL Final  . GFR calc non Af Amer 01/10/2017 >60  >60 mL/min Final  . GFR calc Af Amer 01/10/2017 >60  >60 mL/min Final   Comment: (NOTE) The eGFR has been calculated using the CKD EPI equation. This calculation has not been validated in all clinical situations. eGFR's persistently <60 mL/min signify possible Chronic Kidney Disease.   . Anion gap 01/10/2017 7  5 - 15 Final   No results for input(s): HGB in the last 72 hours. No results for input(s): WBC, RBC, HCT, PLT in the last 72 hours. No results for input(s): NA, K, CL, CO2, BUN, CREATININE, GLUCOSE, CALCIUM in the last 72 hours. No results for input(s): LABPT, INR in  the last 72 hours.  X-Rays:Dg Lumbar Spine 2-3 Views  Addendum Date: 01/18/2017   ADDENDUM REPORT: 01/18/2017 10:24 ADDENDUM: AP view numbered as per lateral view and prior MRI with the lowest completely segmented appearing lumbar shaped vertebra as L5. Electronically Signed   By: Marcello Moores  Register   On: 01/18/2017 10:24   Result Date: 01/18/2017 CLINICAL DATA:  Lumbar surgery. EXAM: LUMBAR SPINE - 2-3 VIEW COMPARISON:  MRI 01/10/2017 . FINDINGS: Lumbar spine numbered as per prior MRI. Diffuse multilevel degenerative change. Mild scoliosis concave left. No acute abnormality identified. IMPRESSION: Diffuse multilevel degenerative changes mild scoliosis concave left. No acute abnormality. Electronically Signed: ByMarcello Moores  Register On: 01/18/2017 09:45   Mr Lumbar Spine Wo Contrast  Result Date: 01/10/2017 CLINICAL DATA:  Initial evaluation for chronic back pain with weakness in legs common new  tingling and groin. EXAM: MRI LUMBAR SPINE WITHOUT CONTRAST TECHNIQUE: Multiplanar, multisequence MR imaging of the lumbar spine was performed. No intravenous contrast was administered. COMPARISON:  Prior MRI from 08/30/2016. FINDINGS: Segmentation: Normal segmentation. Lowest well-formed disc is labeled the L5-S1 level. Alignment: Straightening of the normal lumbar lordosis. No listhesis. Vertebrae: Vertebral body heights are maintained. No evidence for acute or chronic fracture. Signal intensity within the vertebral body bone marrow within normal limits. No discrete or worrisome osseous lesions. Conus medullaris: Extends to the L1 level and appears normal. Mild fibrofatty changes of the filum terminale noted. Paraspinal and other soft tissues: Paraspinous soft tissues within normal limits. Visualized visceral structures are normal. Disc levels: L1-2:  Unremarkable. L2-3: Large central disc extrusion measuring approximately 6 mm in AP diameter, similar to previous. Similar approximately 15 mm of cephalad and caudad migration. Extrusion is subligamentous in location. Resultant severe canal stenosis with the thecal sac measuring 3 mm in AP diameter at its most narrow point. Severe bilateral subarticular stenosis, slightly worse on the right. Changes relatively similar to previous. No significant foraminal encroachment. L3-4: Central disc protrusion with slight inferior migration. Size is relatively stable. Stable mild canal and lateral recess stenosis. No foraminal encroachment. L4-5: Central disc protrusion measuring approximately 5 mm, stable. Slight inferior migration, unchanged. Resultant mild canal with mild to moderate lateral recess stenosis, stable. Stable mild right with mild to moderate left neural foraminal stenosis. L5-S1: Broad-based disc bulge, asymmetric to the right, contacts the descending S1 nerve roots, particularly on the right. Mild facet arthropathy, greater on the left. No significant canal  stenosis. Mild to moderate bilateral foraminal narrowing, slightly greater on the left. IMPRESSION: 1. No significant interval change in appearance of the lumbar spine as compared to previous MRI from 08/30/2016. 2. Large central disc extrusion at L2-3 with resultant severe canal stenosis as above. 3. Smaller central disc protrusions at L3-4 and L4-5, with similar broad posterior disc bulge at L5-S1. 4. Stable mild-to-moderate neural foraminal narrowing at L4-5 and L5-S1. Electronically Signed   By: Jeannine Boga M.D.   On: 01/10/2017 03:31   Dg Spine Portable 1 View  Result Date: 01/18/2017 CLINICAL DATA:  Lumbar disc disease EXAM: PORTABLE SPINE - 1 VIEW COMPARISON:  Radiographs dated 01/18/2017 and MRI dated 01/10/2017 FINDINGS: Instruments are at the L2-3 level. IMPRESSION: Instruments at L2-3. Electronically Signed   By: Lorriane Shire M.D.   On: 01/18/2017 12:47   Dg Spine Portable 1 View  Result Date: 01/18/2017 CLINICAL DATA:  Intraoperative spinal radiograph. EXAM: PORTABLE SPINE - 1 VIEW COMPARISON:  Intraoperative spinal radiograph-earlier same day; lumbar spine MRI - 01/10/2017 FINDINGS: A single spot lateral  projection intraoperative radiographic image of the lower lumbar spine is provided for review. Lumbar spinal labeling is in keeping with preprocedural lumbar spine MRI. Radiopaque surgical instruments are noted posterior to the L2 and L3 vertebral bodies. IMPRESSION: Intraoperative spinal localization as above. Electronically Signed   By: Sandi Mariscal M.D.   On: 01/18/2017 11:42   Dg Spine Portable 1 View  Result Date: 01/18/2017 CLINICAL DATA:  Intraoperative radiograph. L2-3 and L1-2 lumbar decompression. EXAM: PORTABLE SPINE - 1 VIEW COMPARISON:  MRI from 01/10/17 FINDINGS: The spinous processes have been annotated using the same numbering scheme as MRI. There are 3 surgical probes identified which are posterior to the L2 and L3 spinous process ease. IMPRESSION: 1. Spinous  processes have been annotated. Surgical probes are noted posterior to the L2 and L3 spinous processes. Electronically Signed   By: Kerby Moors M.D.   On: 01/18/2017 11:28    EKG: Orders placed or performed during the hospital encounter of 09/01/15  . ED EKG  . ED EKG     Hospital Course: Patient was admitted to [facility] and taken to the OR and underwent the above state procedure without complications.  Patient tolerated the procedure well and was later transferred to the recovery room and then to the orthopaedic floor for postoperative care.  They were given PO and IV analgesics for pain control following their surgery.  They were given 24 hours of postoperative antibiotics.   PT was consulted postop to assist with mobility and transfers.  The patient was allowed to be WBAT with therapy and was taught back precautions. Discharge planning was consulted to help with postop disposition and equipment needs.  Patient had a fair night on the evening of surgery and started to get up OOB with therapy on day one. Patient was seen in rounds and was ready to go home on day two.  They were given discharge instructions and dressing directions.  They were instructed on when to follow up in the office with Dr. Tonita Cong.   Diet: Regular diet Activity:WBAT; Lspine precautions Follow-up:in 10-14 days Disposition - Home Discharged Condition: good   Discharge Instructions    Call MD / Call 911    Complete by:  As directed    If you experience chest pain or shortness of breath, CALL 911 and be transported to the hospital emergency room.  If you develope a fever above 101 F, pus (white drainage) or increased drainage or redness at the wound, or calf pain, call your surgeon's office.   Constipation Prevention    Complete by:  As directed    Drink plenty of fluids.  Prune juice may be helpful.  You may use a stool softener, such as Colace (over the counter) 100 mg twice a day.  Use MiraLax (over the  counter) for constipation as needed.   Diet - low sodium heart healthy    Complete by:  As directed    Increase activity slowly as tolerated    Complete by:  As directed      Allergies as of 01/20/2017   No Known Allergies     Medication List    STOP taking these medications   oxyCODONE-acetaminophen 10-325 MG tablet Commonly known as:  PERCOCET Replaced by:  oxyCODONE-acetaminophen 7.5-325 MG tablet     TAKE these medications   diazepam 2 MG tablet Commonly known as:  VALIUM Take 1 tablet (2 mg total) by mouth every 8 (eight) hours as needed for muscle spasms.  docusate sodium 100 MG capsule Commonly known as:  COLACE Take 1 capsule (100 mg total) by mouth 2 (two) times daily as needed for mild constipation.   lisinopril-hydrochlorothiazide 10-12.5 MG tablet Commonly known as:  PRINZIDE,ZESTORETIC TAKE 1 TABLET BY MOUTH DAILY.   methocarbamol 500 MG tablet Commonly known as:  ROBAXIN Take 1 tablet (500 mg total) by mouth every 6 (six) hours as needed for muscle spasms.   oxyCODONE-acetaminophen 7.5-325 MG tablet Commonly known as:  PERCOCET Take 1-2 tablets by mouth every 4 (four) hours as needed for severe pain. Replaces:  oxyCODONE-acetaminophen 10-325 MG tablet   polyethylene glycol packet Commonly known as:  MIRALAX / GLYCOLAX Take 17 g by mouth daily.            Durable Medical Equipment        Start     Ordered   01/19/17 1211  For home use only DME 3 n 1  Once     01/19/17 1210   01/19/17 1210  For home use only DME Walker rolling  Once    Question:  Patient needs a walker to treat with the following condition  Answer:  Spinal pain   01/19/17 1210     Follow-up Information    Susa Day, MD Follow up in 2 week(s).   Specialty:  Orthopedic Surgery Contact information: [address] [place] [address] [phone]        Susa Day, MD In 2 weeks.   Specialty:  Orthopedic Surgery Contact information: [address] [place] [address] [phone]           Signed: Lacie Draft, PA-C Orthopaedic Surgery 01/20/2017, 7:46 AM

## 2017-01-20 NOTE — Progress Notes (Signed)
Subjective: 2 Days Post-Op Procedure(s) (LRB): Microlumbar decompression L2-3 (N/A) Patient reports pain as 3 on 0-10 scale.    Objective: Vital signs in last 24 hours: Temp:  [98.1 F (36.7 C)-98.4 F (36.9 C)] 98.1 F (36.7 C) (07/20 0441) Pulse Rate:  [86-94] 93 (07/20 0441) Resp:  [15-19] 18 (07/20 0441) BP: (113-159)/(54-86) 117/54 (07/20 0441) SpO2:  [95 %-100 %] 99 % (07/20 0441)  Intake/Output from previous day: 07/19 0701 - 07/20 0700 In: 1560 [P.O.:1560] Out: 650 [Urine:650] Intake/Output this shift: No intake/output data recorded.  No results for input(s): HGB in the last 72 hours. No results for input(s): WBC, RBC, HCT, PLT in the last 72 hours. No results for input(s): NA, K, CL, CO2, BUN, CREATININE, GLUCOSE, CALCIUM in the last 72 hours. No results for input(s): LABPT, INR in the last 72 hours.  Neurologically intact No DVT Assessment/Plan: 2 Days Post-Op Procedure(s) (LRB): Microlumbar decompression L2-3 (N/A) Advance diet Up with therapy Discharge home with home health  Instructions given  Heinrich Fertig C 01/20/2017, 7:44 AM

## 2018-08-04 ENCOUNTER — Encounter: Payer: Self-pay | Admitting: Emergency Medicine

## 2018-08-04 ENCOUNTER — Other Ambulatory Visit: Payer: Self-pay

## 2018-08-04 ENCOUNTER — Emergency Department
Admission: EM | Admit: 2018-08-04 | Discharge: 2018-08-04 | Disposition: A | Discharge status: Home / Self Care | Payer: Managed Care, Other (non HMO) | Attending: Emergency Medicine | Admitting: Emergency Medicine

## 2018-08-04 DIAGNOSIS — F17220 Nicotine dependence, chewing tobacco, uncomplicated: Secondary | ICD-10-CM | POA: Insufficient documentation

## 2018-08-04 DIAGNOSIS — J111 Influenza due to unidentified influenza virus with other respiratory manifestations: Secondary | ICD-10-CM | POA: Insufficient documentation

## 2018-08-04 DIAGNOSIS — I10 Essential (primary) hypertension: Secondary | ICD-10-CM | POA: Insufficient documentation

## 2018-08-04 DIAGNOSIS — R6883 Chills (without fever): Secondary | ICD-10-CM | POA: Diagnosis present

## 2018-08-04 DIAGNOSIS — Z79899 Other long term (current) drug therapy: Secondary | ICD-10-CM | POA: Diagnosis not present

## 2018-08-04 LAB — INFLUENZA PANEL BY PCR (TYPE A & B)
INFLAPCR: NEGATIVE
INFLBPCR: NEGATIVE

## 2018-08-04 MED ORDER — ONDANSETRON 4 MG PO TBDP: 4.0000 mg | ORAL_TABLET | Freq: Three times a day (TID) | ORAL | 0 refills | Status: DC | PRN

## 2018-08-04 MED ORDER — OSELTAMIVIR PHOSPHATE 75 MG PO CAPS: 75.0000 mg | ORAL_CAPSULE | Freq: Two times a day (BID) | ORAL | 0 refills | Status: DC

## 2018-08-04 NOTE — ED Triage Notes (Signed)
Chills and generalized body aches began during night.

## 2018-08-04 NOTE — ED Notes (Signed)
Pt to ED with c/o of chills, nausea and generalized body aches. Pt denies fever, cough, sore throat, vomiting and diarrhea.

## 2018-08-04 NOTE — ED Provider Notes (Signed)
[facility] Emergency Department Provider Note  ____________________________________________  Time seen: Approximately 4:18 PM  I have reviewed the triage vital signs and the nursing notes.   HISTORY  Chief Complaint Chills    HPI Seth Parker is a 33 y.o. male who presents the emergency department complaining of sudden onset of body aches, chills, sore throat, headache, ear pain, nausea.  Patient reports that symptoms started approximately 8 to 10 hours ago.  Symptoms were sudden in nature.  Patient has felt worse throughout the day.  Patient is not been taking medication for this complaint prior to arrival.  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   Patient denies any emesis.  He denies any abdominal pain, chest pain, shortness of breath.   Past Medical History:  Diagnosis Date  . Anxiety   . Depression   . Family history of premature CAD    a. in father with CAD s/p CABG in early 3s  . GERD (gastroesophageal reflux disease)   . Hyperlipidemia   . Hypertension   . Lumbar spinal stenosis    herniated discs  . Morbid obesity ([place])   . Pituitary adenoma ([place])   . Sleep apnea    uses cpap some nights set oon 7   . Spinal stenosis   . Tobacco abuse    a. dipping    Patient Active Problem List   Diagnosis Date Noted  . HNP (herniated nucleus pulposus), lumbar 01/18/2017  . Pituitary microadenoma ([place]) 09/06/2016  . Lumbar herniated disc 08/30/2016  . Unstable angina ([place]) 03/04/2015  . Essential hypertension 08/18/2014  . Stenosis, spinal, lumbar 11/19/2013    Past Surgical History:  Procedure Laterality Date  . colonscopy  2-3 yrs ago  . EYE SURGERY Left    secondary to trauma as a child  . LUMBAR LAMINECTOMY/DECOMPRESSION MICRODISCECTOMY N/A 01/18/2017   Procedure: Microlumbar decompression L2-3;  Surgeon: Susa Day, MD;  Location: [facility] ORS;  Service: Orthopedics;  Laterality: N/A;  120  Mins    Prior to Admission medications   Medication Sig Start Date End Date Taking? Authorizing Provider  diazepam (VALIUM) 2 MG tablet Take 1 tablet (2 mg total) by mouth every 8 (eight) hours as needed for muscle spasms. 01/10/17   Paulette Blanch, MD  docusate sodium (COLACE) 100 MG capsule Take 1 capsule (100 mg total) by mouth 2 (two) times daily as needed for mild constipation. 01/18/17   Susa Day, MD  lisinopril-hydrochlorothiazide (PRINZIDE,ZESTORETIC) 10-12.5 MG tablet TAKE 1 TABLET BY MOUTH DAILY. 11/18/16   Fisher, Linden Dolin, PA-C  methocarbamol (ROBAXIN) 500 MG tablet Take 1 tablet (500 mg total) by mouth every 6 (six) hours as needed for muscle spasms. 01/18/17   Susa Day, MD  ondansetron (ZOFRAN-ODT) 4 MG disintegrating tablet Take 1 tablet (4 mg total) by mouth every 8 (eight) hours as needed for nausea or vomiting. 08/04/18   Dafney Farler, Charline Bills, PA-C  oseltamivir (TAMIFLU) 75 MG capsule Take 1 capsule (75 mg total) by mouth 2 (two) times daily. 08/04/18   Xoe Hoe, Charline Bills, PA-C  oxyCODONE-acetaminophen (PERCOCET) 7.5-325 MG tablet Take 1-2 tablets by mouth every 4 (four) hours as needed for severe pain. 01/18/17   Susa Day, MD  polyethylene glycol (MIRALAX / GLYCOLAX) packet Take 17 g by mouth daily. 01/18/17   Susa Day, MD    Allergies Patient has no known allergies.  Family History  Problem Relation Age of Onset  .  Heart disease Father   . Heart attack Father 26  . Hypertension Father   . Hyperlipidemia Father   . Mental illness Father        bipolar  . Hypertension Mother   . Cancer Maternal Aunt        breast  . Cancer Maternal Uncle 50       colon  . Cancer Maternal Grandmother     Social History Social History   Tobacco Use  . Smoking status: Never Smoker  . Smokeless tobacco: Current User    Types: Chew  Substance Use Topics  . Alcohol use: Yes  . Drug use: No     Review of Systems  Constitutional: Positive fever/chills.   Positive for body aches. Eyes: No visual changes. No discharge ENT: Positive for sore throat and bilateral ear pain Cardiovascular: no chest pain. Respiratory: no cough. No SOB. Gastrointestinal: No abdominal pain.  Positive nausea, no vomiting.  No diarrhea.  No constipation. Genitourinary: Negative for dysuria. No hematuria Musculoskeletal: Negative for musculoskeletal pain. Skin: Negative for rash, abrasions, lacerations, ecchymosis. Neurological: Negative for headaches, focal weakness or numbness. 10-point ROS otherwise negative.  ____________________________________________   PHYSICAL EXAM:  VITAL SIGNS: ED Triage Vitals  Enc Vitals Group     BP 08/04/18 1508 (!) 184/104     Pulse Rate 08/04/18 1508 84     Resp 08/04/18 1508 20     Temp 08/04/18 1508 98.9 F (37.2 C)     Temp Source 08/04/18 1508 Oral     SpO2 --      Weight 08/04/18 1510 300 lb (136.1 kg)     Height 08/04/18 1510 5\' 10"  (1.778 m)     Head Circumference --      Peak Flow --      Pain Score 08/04/18 1510 4     Pain Loc --      Pain Edu? --      Excl. in [place]? --      Constitutional: Alert and oriented.  Moderately ill appearing but in no acute distress. Eyes: Conjunctivae are normal. PERRL. EOMI. Head: Atraumatic. ENT:      Ears: EACs unremarkable bilaterally.  TMs are bulging bilaterally.      Nose: Mild clear congestion/rhinnorhea.      Mouth/Throat: Mucous membranes are moist.  Oropharynx is mildly erythematous but nonedematous.  Uvula is midline. Neck: No stridor.  Neck is supple full range of motion Hematological/Lymphatic/Immunilogical: Scattered, mobile, nontender anterior cervical lymphadenopathy. Cardiovascular: Normal rate, regular rhythm. Normal S1 and S2.  Good peripheral circulation. Respiratory: Normal respiratory effort without tachypnea or retractions. Lungs CTAB. Good air entry to the bases with no decreased or absent breath sounds. Gastrointestinal: Bowel sounds 4 quadrants. Soft  and nontender to palpation. No guarding or rigidity. No palpable masses. No distention. No CVA tenderness. Musculoskeletal: Full range of motion to all extremities. No gross deformities appreciated. Neurologic:  Normal speech and language. No gross focal neurologic deficits are appreciated.  Skin:  Skin is warm, dry and intact. No rash noted. Psychiatric: Mood and affect are normal. Speech and behavior are normal. Patient exhibits appropriate insight and judgement.   ____________________________________________   LABS (all labs ordered are listed, but only abnormal results are displayed)  Labs Reviewed  INFLUENZA PANEL BY PCR (TYPE A & B)   ____________________________________________  EKG   ____________________________________________  RADIOLOGY   No results found.  ____________________________________________    PROCEDURES  Procedure(s) performed:    Procedures  Medications - No data to display   ____________________________________________   INITIAL IMPRESSION / ASSESSMENT AND PLAN / ED COURSE  Pertinent labs & imaging results that were available during my care of the patient were reviewed by me and considered in my medical decision making (see chart for details).  Review of the [place] CSRS was performed in accordance of the [place] prior to dispensing any controlled drugs.      Patient's diagnosis is consistent with influenza.  Patient presents emergency department with sudden onset of flulike symptoms.  Patient did have a negative flu test, but discussing the test with the patient it does not appear that swab was inserted very deeply into the nasal cavity.  This is likely a false negative.  At this time, I have discussed labs with the patient, however most likely differential includes viral illness, viral gastroenteritis or influenza.  Patient opts for no testing at this time which I agree with.  Patient will be prescribed Tamiflu but I have discussed side  effects with the patient.  Tylenol and Motrin at home with plenty of fluids.  If symptoms change, worsen patient may be evaluated with labs and imaging at that time..  Patient is given ED precautions to return to the ED for any worsening or new symptoms.     ____________________________________________  FINAL CLINICAL IMPRESSION(S) / ED DIAGNOSES  Final diagnoses:  Influenza      NEW MEDICATIONS STARTED DURING THIS VISIT:  ED Discharge Orders         Ordered    oseltamivir (TAMIFLU) 75 MG capsule  2 times daily     08/04/18 1631    ondansetron (ZOFRAN-ODT) 4 MG disintegrating tablet  Every 8 hours PRN     08/04/18 1631              This chart was dictated using voice recognition software/Dragon. Despite best efforts to proofread, errors can occur which can change the meaning. Any change was purely unintentional.    Darletta Moll, PA-C 08/04/18 1632    Earleen Newport, MD 08/04/18 2228

## 2018-12-07 ENCOUNTER — Ambulatory Visit: Payer: Managed Care, Other (non HMO) | Admitting: Adult Health

## 2018-12-07 ENCOUNTER — Encounter: Payer: Self-pay | Admitting: Adult Health

## 2018-12-07 ENCOUNTER — Other Ambulatory Visit: Payer: Self-pay

## 2018-12-07 ENCOUNTER — Telehealth: Payer: Self-pay | Admitting: *Deleted

## 2018-12-07 DIAGNOSIS — Z20822 Contact with and (suspected) exposure to covid-19: Secondary | ICD-10-CM

## 2018-12-07 DIAGNOSIS — Z20828 Contact with and (suspected) exposure to other viral communicable diseases: Secondary | ICD-10-CM

## 2018-12-07 DIAGNOSIS — R6889 Other general symptoms and signs: Secondary | ICD-10-CM

## 2018-12-07 NOTE — Progress Notes (Signed)
Virtual Visit via Telephone Note  I connected with Seth Parker on 12/07/18 at 11:00 AM EDT by telephone and verified that I am speaking with the correct person using two identifiers.  Location: Patient: in his residence  Provider: in office at [facility].    I discussed the limitations, risks, security and privacy concerns of performing an evaluation and management service by telephone and the availability of in person appointments. I also discussed with the patient that there may be a patient responsible charge related to this service. The patient expressed understanding and agreed to proceed.   History of Present Illness: No vitals available.  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 He had on protective equipment at that time.  The patient was Covid 19 positive.   He reports his temperature was 99.4 temp on 12/06/18 yesterday. He also developed body aches, sore throat, weakness, and cough. Non productive cough.    He reports today he is feeling much better, not coughing, still has mild body aches.   Patient  denies any fever, body aches,chills, rash, chest pain, shortness of breath, nausea, vomiting, or diarrhea.     Observations/Objective:  Patient is alert and oriented and responsive to questions Engages in conversation with provider. Speaks in full sentences without any pauses without any shortness of breath or distress.   He is pleasant and alert.   Assessment and Plan:  Suspected Covid-19 Virus Infection  Close Exposure to Covid-19 Virus    Follow Up Instructions: Ordered testing for Covid. He reports he is unable to go until Monday.   See work note for 10 days quarantine and instructions for after work return   I discussed the assessment and treatment plan with the patient. The patient was provided an opportunity to ask questions and all were answered. The patient agreed with the plan and  demonstrated an understanding of the instructions.   The patient was advised to call back or seek an in-person evaluation if the symptoms worsen or if the condition fails to improve as anticipated.  I provided 20 minutes of non-face-to-face time during this encounter.   Marcille Buffy, FNP

## 2018-12-07 NOTE — Telephone Encounter (Addendum)
Contacted pt to arrange COVID testing; he would like for it to come on 12/10/2018; pt offered and accepted appointment at [facility] site 12/09/3028 at 0800; pt given address, location, and instructions that he and all occupants of his vehicle should wear masks; he verbalized understanding; orders placed per protocol.   ----- Message from Doreen Beam, [place] sent at 12/07/2018  1:18 PM EDT ----- I would like patient screened for Covid 19 he is nor available to come until next week. Please call to schedule.  Thanks, Laverna Peace MSN, AGNP-C, FNP-C

## 2018-12-10 ENCOUNTER — Other Ambulatory Visit: Payer: Self-pay

## 2018-12-10 DIAGNOSIS — Z20822 Contact with and (suspected) exposure to covid-19: Secondary | ICD-10-CM

## 2018-12-11 LAB — NOVEL CORONAVIRUS, NAA: SARS-CoV-2, NAA: NOT DETECTED

## 2018-12-12 ENCOUNTER — Telehealth: Payer: Self-pay | Admitting: *Deleted

## 2018-12-12 NOTE — Telephone Encounter (Signed)
Pt given results of negative COVID test; he denies symptoms including shortness of breath, fever, and cough; he verbalized understanding.

## 2018-12-13 ENCOUNTER — Telehealth: Payer: Self-pay | Admitting: Adult Health

## 2018-12-13 NOTE — Telephone Encounter (Signed)
Called  patient 12/13/2018 8:56  to relay the following information below: No answer had to leave a generic message to return call to the office at his convenience :if patient returns call office will relay this message to him and answer any questions related to this call.  Patient has already been informed by the call center of his negative COVID-19 test.  He was tested once and meets symptom based criteria to return to work meaning he must be out 10 days from symptom onset and also have been fever free for 72  hours and no fever reducing medications with all other symptoms improving.  If he needs a note for work stating that he was COVID negative this can be provided as well.  He is able to return to work on 12/17/2018 if he has met the above criteria.  He has a work note stating this.

## 2019-01-28 ENCOUNTER — Other Ambulatory Visit: Payer: Self-pay

## 2019-01-28 DIAGNOSIS — Z20822 Contact with and (suspected) exposure to covid-19: Secondary | ICD-10-CM

## 2019-07-22 ENCOUNTER — Ambulatory Visit (INDEPENDENT_AMBULATORY_CARE_PROVIDER_SITE_OTHER): Payer: Managed Care, Other (non HMO)

## 2019-07-22 ENCOUNTER — Encounter: Payer: Self-pay | Admitting: Emergency Medicine

## 2019-07-22 ENCOUNTER — Ambulatory Visit
Admission: EM | Admit: 2019-07-22 | Discharge: 2019-07-22 | Disposition: A | Discharge status: Home / Self Care | Payer: Managed Care, Other (non HMO) | Attending: Emergency Medicine | Admitting: Emergency Medicine

## 2019-07-22 ENCOUNTER — Other Ambulatory Visit: Payer: Self-pay

## 2019-07-22 DIAGNOSIS — Z20822 Contact with and (suspected) exposure to covid-19: Secondary | ICD-10-CM | POA: Diagnosis not present

## 2019-07-22 DIAGNOSIS — U071 COVID-19: Secondary | ICD-10-CM | POA: Diagnosis not present

## 2019-07-22 DIAGNOSIS — J189 Pneumonia, unspecified organism: Secondary | ICD-10-CM

## 2019-07-22 DIAGNOSIS — R05 Cough: Secondary | ICD-10-CM

## 2019-07-22 MED ORDER — HYDROCOD POLST-CPM POLST ER 10-8 MG/5ML PO SUER: 5.0000 mL | Freq: Two times a day (BID) | ORAL | 0 refills | Status: DC | PRN

## 2019-07-22 MED ORDER — AZITHROMYCIN 250 MG PO TABS: ORAL_TABLET | ORAL | 0 refills | Status: DC

## 2019-07-22 MED ORDER — AMOXICILLIN-POT CLAVULANATE ER 1000-62.5 MG PO TB12: 2.0000 | ORAL_TABLET | Freq: Two times a day (BID) | ORAL | 0 refills | Status: AC

## 2019-07-22 NOTE — ED Provider Notes (Addendum)
[place]   Name: VERNA EANS DOB: [birth year] MRN: [id number] CSN: [id number] PCP: Patient, No Pcp Per  Arrival date and time:  07/22/19 1112  Chief Complaint:  Cough   NOTE: Prior to seeing the patient today, I have reviewed the triage nursing documentation and vital signs. Clinical staff has updated patient's PMH/PSHx, current medication list, and drug allergies/intolerances to ensure comprehensive history available to assist in medical decision making.   History:   HPI: DAIMIAN BOOTH is a 34 y.o. male who presents today with complaints of fatigue/malaise, diffuse myalgia, cough, pleuritic chest pain, diaphoresis, BILATERAL ear pain, sore throat, and generalized headaches that started approximately 3 days ago. Patient denies fevers. He denies any  Significant shortness of breath or wheezing. Cough is productive of clear sputum that has been intermittently blood tinged. He has experienced nausea, vomiting, and diarrhea. No abdominal pain. He is eating and drinking well. Patient denies any perceived alterations to his sense of taste or smell. 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 No one else is his home has experienced a similar symptom constellation. He has not been tested for SARS-CoV-2 (novel coronavirus) in the past 14 days; last tested negative a couple of months ago per his report. Patient has not been vaccinated for influenza this season. In efforts to conservatively manage his symptoms at home, the patient notes that he has used Dayquil, Nyquil, and APAP, which only minimally helped to improve his symptoms.    Past Medical History:  Diagnosis Date  . Anxiety   . Depression   . Family history of premature CAD    a. in father with CAD s/p CABG in early 83s  . GERD (gastroesophageal reflux disease)   . Hyperlipidemia   . Hypertension   . Lumbar spinal stenosis    herniated discs    . Morbid obesity ([place])   . Pituitary adenoma ([place])   . Sleep apnea    uses cpap some nights set oon 7   . Spinal stenosis   . Tobacco abuse    a. dipping    Past Surgical History:  Procedure Laterality Date  . colonscopy  2-3 yrs ago  . EYE SURGERY Left    secondary to trauma as a child  . LUMBAR LAMINECTOMY/DECOMPRESSION MICRODISCECTOMY N/A 01/18/2017   Procedure: Microlumbar decompression L2-3;  Surgeon: Susa Day, MD;  Location: [facility];  Service: Orthopedics;  Laterality: N/A;  120 Mins    Family History  Problem Relation Age of Onset  . Heart disease Father   . Heart attack Father 39  . Hypertension Father   . Hyperlipidemia Father   . Mental illness Father        bipolar  . Hypertension Mother   . Cancer Maternal Aunt        breast  . Cancer Maternal Uncle 50       colon  . Cancer Maternal Grandmother     Social History   Tobacco Use  . Smoking status: Never Smoker  . Smokeless tobacco: Current User    Types: Chew  Substance Use Topics  . Alcohol use: Yes  . Drug use: No    Patient Active Problem List   Diagnosis Date Noted  . HNP (herniated nucleus pulposus), lumbar 01/18/2017  . 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 Lumbar herniated disc 08/30/2016  . Unstable angina ([place]) 03/04/2015  . Essential hypertension 08/18/2014  .  Stenosis, spinal, lumbar 11/19/2013    Home Medications:    No outpatient medications have been marked as taking for the 07/22/19 encounter [facility] Encounter).    Allergies:   Patient has no known allergies.  Review of Systems (ROS): Review of Systems  Constitutional: Positive for diaphoresis and fatigue. Negative for fever.  HENT: Positive for ear pain and sore throat. Negative for congestion, ear discharge, postnasal drip, rhinorrhea, sinus pressure, sinus pain, sneezing and trouble swallowing.   Eyes: Negative for pain, discharge and redness.  Respiratory: Positive for cough. Negative for chest tightness and  shortness of breath.        (+) blood tinged sputum  Cardiovascular: Positive for chest pain (pleuritic). Negative for palpitations.  Gastrointestinal: Positive for diarrhea, nausea and vomiting. Negative for abdominal pain.  Musculoskeletal: Positive for myalgias. Negative for arthralgias, back pain and neck pain.  Skin: Negative for color change, pallor and rash.  Neurological: Positive for headaches. Negative for dizziness, syncope and weakness.  Hematological: Negative for adenopathy.     Vital Signs: Today's Vitals   07/22/19 1134 07/22/19 1139 07/22/19 1250 07/22/19 1253  BP:  (!) 160/120 (!) 140/94   Pulse:  84    Resp:  18    Temp:  98.4 F (36.9 C)    TempSrc:  Oral    SpO2:  96%    Weight: 290 lb (131.5 kg)     Height: 5\' 10"  (1.778 m)     PainSc: 3    3     Physical Exam: Physical Exam  Constitutional: He is oriented to person, place, and time and well-developed, well-nourished, and in no distress.  Acutely ill appearing; fatigued/listless.  HENT:  Head: Normocephalic and atraumatic.  Right Ear: No drainage. Tympanic membrane is erythematous. Tympanic membrane is not bulging. A middle ear effusion (mild serous) is present.  Left Ear: No drainage. Tympanic membrane is erythematous. Tympanic membrane is not bulging. A middle ear effusion (mild serous) is present.  Nose: Nose normal.  Mouth/Throat: Uvula is midline and mucous membranes are normal. Posterior oropharyngeal edema (mild) and posterior oropharyngeal erythema (+) clear PND present. No oropharyngeal exudate.  Eyes: Pupils are equal, round, and reactive to light.  Cardiovascular: Normal rate, regular rhythm, normal heart sounds and intact distal pulses.  Pulmonary/Chest: Effort normal. He has decreased breath sounds in the left upper field and the left lower field. He has rhonchi (scattered; worse in LEFT lung fields). He exhibits no tenderness.  Significant deep cough noted in clinic. No SOB or increased WOB.  No distress. Able to speak in complete sentences without difficulties. SPO2 96% on RA.  Neurological: He is alert and oriented to person, place, and time. Gait normal.  Skin: Skin is warm and dry. No rash noted. He is not diaphoretic.  Psychiatric: Mood, memory, affect and judgment normal.  Nursing note and vitals reviewed.   Urgent Care Treatments / Results:   Orders Placed This Encounter  Procedures  . Novel Coronavirus, NAA (Hosp order, Send-out to Ref Lab; TAT 18-24 hrs  . DG Chest 2 View  . Recheck vitals    LABS: PLEASE NOTE: all labs that were ordered this encounter are listed, however only abnormal results are displayed.  NOVEL CORONAVIRUS, NAA (HOSP ORDER, SEND-OUT TO REF LAB; TAT 18-24 HRS)    EKG: -None  RADIOLOGY: DG Chest 2 View  Result Date: 07/22/2019 CLINICAL DATA:  Cough with hemoptysis.  High risk COVID-19 exposure. EXAM: CHEST - 2 VIEW COMPARISON:  September 01, 2015 FINDINGS: Possible subtle opacity in the left upper lobe versus confluence of shadows. The heart, hila, mediastinum, lungs, and pleura are otherwise unremarkable. IMPRESSION: Suggested subtle opacity in the left upper lobe may be infectious. Recommend short-term follow-up imaging to ensure resolution. Electronically Signed   By: Dorise Bullion III M.D   On: 07/22/2019 12:35    PROCEDURES: Procedures  MEDICATIONS RECEIVED THIS VISIT: Medications - No data to display  PERTINENT CLINICAL COURSE NOTES/UPDATES:   Initial Impression / Assessment and Plan / Urgent Care Course:  Pertinent labs & imaging results that were available during my care of the patient were personally reviewed by me and considered in my medical decision making (see lab/imaging section of note for values and interpretations).  ELIZJAH SIEMEN is a 34 y.o. male who presents to [facility] today with complaints of Cough  Patient acutely ill appearing (non-toxic) appearing in clinic today. He does not appear to be in any  acute distress. Presenting symptoms (see HPI) and exam as documented above. He presents with symptoms associated with SARS-CoV-2 (novel coronavirus). Patient is at increased risk for SARS-CoV-2 infection given multiple occupational exposures. Discussed typical symptom constellation. Reviewed potential for infection and need for testing. Patient amenable to being tested. SARS-CoV-2 swab collected by certified clinical staff. Discussed variable turn around times associated with testing, as swabs are being processed at [facility], and have been taking between 24-48 hours to come back. He was advised to self quarantine, per [facility] DHHS guidelines, until negative results received. These measures are being implemented out of an abundance of caution to prevent transmission and spread during the current SARS-CoV-2 pandemic.  Radiographs of the chest performed today revealed opacity in the LEFT upper lung lobe that was felt to be potentially of an infectious etiology; bacterial vs. viral unable to be determined at this time. Until ruled out with confirmatory lab testing, SARS-CoV-2 remains part of the differential. His testing is pending at this time, and there is high suspicion that the patient is infected with the virus. Patient has multiple co-morbidities that increase his SARS-CoV-2 morbidity and mortality risk including a BMI of 41.61 kg/m, HTN, HLD, and OSAH. If patient found to be positive for SARS-CoV-2, his co-morbidities should qualify him for the monoclonal antibody (bamlanivimab). In efforts to cover for bacterial etiology while waiting on SARS-CoV-2 testing, will empirically cover for CAP using a 7 day course of amoxicillin-clavulanate XR and a 5 day course of azithromycin. Discussed supportive care measures at home during acute phase of illness. Patient to rest as much as possible. He was encouraged to ensure adequate hydration (water and ORS) to prevent dehydration and electrolyte derangements. Patient may use  APAP and/or IBU on an as needed basis for pain/fever. Cough is significant causing sleep disruptions, pleuritic chest pain, and hemoptysis. Will send in a supply of Tussionex for PRN use. He was educated on the indications and associated side effects of this medication.   Current clinical condition warrants patient being out of work in order to quarantine while waiting for testing results. He was provided with the appropriate documentation to provide to his place of employment that will allow for him to RTW on 07/26/19 with no restrictions. RTW is contingent on his SARS-CoV-2 test results being reviewed as negative.     Discussed follow up with primary care physician in 1 week for re-evaluation. I have reviewed the follow up and strict return precautions for any new or worsening symptoms. Patient is aware of symptoms that  would be deemed urgent/emergent, and would thus require further evaluation either here or in the emergency department. At the time of discharge, he verbalized understanding and consent with the discharge plan as it was reviewed with him. All questions were fielded by provider and/or clinic staff prior to patient discharge.    ADDENDUM (07/24/2019 @ 1320): Received communication from patient and [facility] that prescribed Augmentin XR on back order with an unknown delivery time. Rx changed to high dose amoxicillin (1 gm TID x 7 days). Patient to continue prescribed azithromycin course to cover for atypicals. Aware of need to ensure that medications are taken with food to prevent GI symptoms associated with antimicrobial therapy.   Final Clinical Impressions / Urgent Care Diagnoses:   Final diagnoses:  Community acquired pneumonia of left upper lobe of lung  Suspected COVID-19 virus infection  Encounter for laboratory testing for COVID-19 virus    New Prescriptions:  [place] Controlled Substance Registry consulted? Yes, I have consulted the [place] Controlled Substances Registry for this  patient, and feel the risk/benefit ratio today is favorable for proceeding with this prescription for a controlled substance.  . Discussed use of controlled substance medication to treat his acute symptoms.  o Reviewed [place] STOP Act regulations  o Clinic does not refill controlled substances over the phone without face to face evaluation.  . Safety precautions reviewed.  o Medications should not be sold or taken with alcohol.  o Avoid use while working, driving, or operating heavy machinery.  o Side effects associated with the use of this particular medication reviewed. - Patient understands that this medication can cause CNS depression, increase his risk of falls, and even lead to overdose that may result in death, if used outside of the parameters that he and I discussed.  With all of this in mind, he knowingly accepts the risks and responsibilities associated with intended course of treatment, and elects to responsibly proceed as discussed.  Meds ordered this encounter  Medications  . amoxicillin-clavulanate (AUGMENTIN XR) 1000-62.5 MG 12 hr tablet    Sig: Take 2 tablets by mouth 2 (two) times daily for 7 days.    Dispense:  28 tablet    Refill:  0  . azithromycin (ZITHROMAX) 250 MG tablet    Sig: 2 tabs (500 mg) x 1 day, then 1 tab (250 mg) x 4 days.    Dispense:  6 tablet    Refill:  0  . chlorpheniramine-HYDROcodone (TUSSIONEX PENNKINETIC ER) 10-8 MG/5ML SUER    Sig: Take 5 mLs by mouth every 12 (twelve) hours as needed for cough.    Dispense:  70 mL    Refill:  0    Recommended Follow up Care:  Patient encouraged to follow up with the following provider within the specified time frame, or sooner as dictated by the severity of his symptoms. As always, he was instructed that for any urgent/emergent care needs, he should seek care either here or in the emergency department for more immediate evaluation.  Follow-up Information    PCP In 1 week.   Why: General reassessment of  symptoms if not improving        NOTE: This note was prepared using Lobbyist along with smaller Company secretary. Despite my best ability to proofread, there is the potential that transcriptional errors may still occur from this process, and are completely unintentional.    Karen Kitchens, NP 07/24/19 1129    Karen Kitchens, NP 07/24/19 1323

## 2019-07-22 NOTE — Discharge Instructions (Addendum)
It was very nice seeing you today in clinic. Thank you for entrusting me with your care.   Chest film showed an early pneumonia. Will proceed with treatment to prevent worsening. Make efforts to turn, cough, and deep breathe frequently to promote lung expansion and subsequent worsening. Sending medication for cough that will make you sleepy; no driving.  Rest and stay HYDRATED. Water and electrolyte containing beverages.  May use Tylenol and/or Ibuprofen as needed for pain/fever.  You were tested for SARS-CoV-2 (novel coronavirus) today. Testing is performed by an outside lab ([facility]) and has variable turn around times ranging between 2-5 days. Current recommendations from the the CDC and [place] DHHS require that you remain out of work in order to quarantine at home until negative test results are have been received. In the event that your test results are positive, you will be contacted with further directives. These measures are being implemented out of an abundance of caution to prevent transmission and spread during the current SARS-CoV-2 pandemic.  Make arrangements to follow up with your regular doctor in 1 week for re-evaluation if not improving. If your symptoms/condition worsens, please seek follow up care either here or in the ER. Please remember, our [facility] providers are "right here with you" when you need [place]. Again, it was my pleasure to take care of you today. Thank you for choosing our clinic. I hope that you start to feel better quickly.   Honor Loh, MSN, APRN, FNP-C, CEN Advanced Practice Provider [facility]

## 2019-07-22 NOTE — ED Triage Notes (Addendum)
Pt c/o cough, chest pain while taking a deep breath, shortness of breath, sore throat, headache,  fatigue, weakness, diarrhea and vomiting. Started about 5 days ago. 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  Pt also states that he had bloody sputum this am.

## 2019-07-24 ENCOUNTER — Telehealth: Payer: Self-pay | Admitting: Infectious Diseases

## 2019-07-24 ENCOUNTER — Other Ambulatory Visit: Payer: Self-pay | Admitting: Infectious Diseases

## 2019-07-24 DIAGNOSIS — U071 COVID-19: Secondary | ICD-10-CM

## 2019-07-24 LAB — NOVEL CORONAVIRUS, NAA (HOSP ORDER, SEND-OUT TO REF LAB; TAT 18-24 HRS): SARS-CoV-2, NAA: DETECTED — AB

## 2019-07-24 NOTE — Telephone Encounter (Signed)
Called to discuss with patient about Covid symptoms and the use of bamlanivimab, a monoclonal antibody infusion for those with mild to moderate Covid symptoms and at a high risk of hospitalization.  Pt is qualified for this infusion at the [facility] infusion center due to BMI>35   He started with sinus infection symptoms on Thursday 07/18/19. He is still having "massive headaches" with bad pressure behind his eyes. He has had dizziness and vomiting up through yesterday.   He has left upper lobe opacity.

## 2019-07-24 NOTE — Telephone Encounter (Signed)
Called to discuss with patient about Covid symptoms and the use of bamlanivimab, a monoclonal antibody infusion for those with mild to moderate Covid symptoms and at a high risk of hospitalization.  Pt is qualified for this infusion at the [facility] infusion center due to [employer] left to call back  MyChart message also sent.

## 2019-07-24 NOTE — Progress Notes (Signed)
  I connected by phone with Seth Parker on 07/24/2019 at 11:53 AM to discuss the potential use of an new treatment for mild to moderate COVID-19 viral infection in non-hospitalized patients.  This patient is a 34 y.o. male that meets the FDA criteria for Emergency Use Authorization of bamlanivimab or casirivimab\imdevimab.  Has a (+) direct SARS-CoV-2 viral test result  Has mild or moderate COVID-19   Is ? 36 years of age and weighs ? 40 kg  Is NOT hospitalized due to COVID-19  Is NOT requiring oxygen therapy or requiring an increase in baseline oxygen flow rate due to COVID-19  Is within 10 days of symptom onset  Has at least one of the high risk factor(s) for progression to severe COVID-19 and/or hospitalization as defined in EUA.  Specific high risk criteria : BMI >/= 35   I have spoken and communicated the following to the patient or parent/caregiver:  1. FDA has authorized the emergency use of bamlanivimab and casirivimab\imdevimab for the treatment of mild to moderate COVID-19 in adults and pediatric patients with positive results of direct SARS-CoV-2 viral testing who are 29 years of age and older weighing at least 40 kg, and who are at high risk for progressing to severe COVID-19 and/or hospitalization.  2. The significant known and potential risks and benefits of bamlanivimab and casirivimab\imdevimab, and the extent to which such potential risks and benefits are unknown.  3. Information on available alternative treatments and the risks and benefits of those alternatives, including clinical trials.  4. Patients treated with bamlanivimab and casirivimab\imdevimab should continue to self-isolate and use infection control measures (e.g., wear mask, isolate, social distance, avoid sharing personal items, clean and disinfect "high touch" surfaces, and frequent handwashing) according to CDC guidelines.   5. The patient or parent/caregiver has the option to accept or refuse  bamlanivimab or casirivimab\imdevimab .  After reviewing this information with the patient, The patient agreed to proceed with receiving the bamlanimivab infusion and will be provided a copy of the Fact sheet prior to receiving the infusion.Seth Parker 07/24/2019 11:53 AM

## 2019-07-25 ENCOUNTER — Encounter (HOSPITAL_COMMUNITY): Payer: Self-pay

## 2019-07-26 ENCOUNTER — Ambulatory Visit (HOSPITAL_COMMUNITY)
Admission: RE | Admit: 2019-07-26 | Discharge: 2019-07-26 | Disposition: A | Discharge status: Home / Self Care | Payer: Managed Care, Other (non HMO) | Source: Ambulatory Visit | Attending: Pulmonary Disease | Admitting: Pulmonary Disease

## 2019-07-26 DIAGNOSIS — Z23 Encounter for immunization: Secondary | ICD-10-CM | POA: Diagnosis not present

## 2019-07-26 DIAGNOSIS — U071 COVID-19: Secondary | ICD-10-CM | POA: Diagnosis present

## 2019-07-26 MED ORDER — METHYLPREDNISOLONE SODIUM SUCC 125 MG IJ SOLR: 125.0000 mg | Freq: Once | INTRAMUSCULAR | Status: DC | PRN

## 2019-07-26 MED ORDER — SODIUM CHLORIDE 0.9 % IV SOLN
INTRAVENOUS | Status: DC | PRN
  Administered 2019-07-26: 11:00:00 250 mL via INTRAVENOUS

## 2019-07-26 MED ORDER — ALBUTEROL SULFATE HFA 108 (90 BASE) MCG/ACT IN AERS: 2.0000 | INHALATION_SPRAY | Freq: Once | RESPIRATORY_TRACT | Status: DC | PRN

## 2019-07-26 MED ORDER — DIPHENHYDRAMINE HCL 50 MG/ML IJ SOLN: 50.0000 mg | Freq: Once | INTRAMUSCULAR | Status: DC | PRN

## 2019-07-26 MED ORDER — FAMOTIDINE IN NACL 20-0.9 MG/50ML-% IV SOLN: 20.0000 mg | Freq: Once | INTRAVENOUS | Status: DC | PRN

## 2019-07-26 MED ORDER — SODIUM CHLORIDE 0.9 % IV SOLN
700.0000 mg | Freq: Once | INTRAVENOUS | Status: AC
  Administered 2019-07-26: 700 mg via INTRAVENOUS
  Filled 2019-07-26: qty 20

## 2019-07-26 MED ORDER — EPINEPHRINE 0.3 MG/0.3ML IJ SOAJ: 0.3000 mg | Freq: Once | INTRAMUSCULAR | Status: DC | PRN

## 2019-07-26 NOTE — Discharge Instructions (Signed)

## 2019-07-26 NOTE — Progress Notes (Signed)
  Diagnosis: COVID-19  Physician: Dr. Joya Gaskins  Procedure: Covid Infusion Clinic Med: bamlanivimab infusion - Provided patient with bamlanimivab fact sheet for patients, parents and caregivers prior to infusion.  Complications: No immediate complications noted.  Discharge: Discharged home   Tia Masker 07/26/2019

## 2019-08-04 ENCOUNTER — Emergency Department: Payer: Managed Care, Other (non HMO)

## 2019-08-04 ENCOUNTER — Other Ambulatory Visit: Payer: Self-pay

## 2019-08-04 ENCOUNTER — Emergency Department
Admission: EM | Admit: 2019-08-04 | Discharge: 2019-08-04 | Disposition: A | Discharge status: Home / Self Care | Payer: Managed Care, Other (non HMO) | Attending: Student | Admitting: Student

## 2019-08-04 DIAGNOSIS — F17220 Nicotine dependence, chewing tobacco, uncomplicated: Secondary | ICD-10-CM | POA: Insufficient documentation

## 2019-08-04 DIAGNOSIS — Z8616 Personal history of COVID-19: Secondary | ICD-10-CM | POA: Diagnosis not present

## 2019-08-04 DIAGNOSIS — R079 Chest pain, unspecified: Secondary | ICD-10-CM | POA: Diagnosis not present

## 2019-08-04 DIAGNOSIS — Z79899 Other long term (current) drug therapy: Secondary | ICD-10-CM | POA: Insufficient documentation

## 2019-08-04 DIAGNOSIS — I10 Essential (primary) hypertension: Secondary | ICD-10-CM | POA: Diagnosis not present

## 2019-08-04 LAB — CBC WITH DIFFERENTIAL/PLATELET
Abs Immature Granulocytes: 0.03 10*3/uL (ref 0.00–0.07)
Basophils Absolute: 0.1 10*3/uL (ref 0.0–0.1)
Basophils Relative: 1 %
Eosinophils Absolute: 0.1 10*3/uL (ref 0.0–0.5)
Eosinophils Relative: 1 %
HCT: 47 % (ref 39.0–52.0)
Hemoglobin: 15.8 g/dL (ref 13.0–17.0)
Immature Granulocytes: 0 %
Lymphocytes Relative: 30 %
Lymphs Abs: 2.3 10*3/uL (ref 0.7–4.0)
MCH: 29.2 pg (ref 26.0–34.0)
MCHC: 33.6 g/dL (ref 30.0–36.0)
MCV: 86.9 fL (ref 80.0–100.0)
Monocytes Absolute: 0.6 10*3/uL (ref 0.1–1.0)
Monocytes Relative: 8 %
Neutro Abs: 4.5 10*3/uL (ref 1.7–7.7)
Neutrophils Relative %: 60 %
Platelets: 277 10*3/uL (ref 150–400)
RBC: 5.41 MIL/uL (ref 4.22–5.81)
RDW: 11.9 % (ref 11.5–15.5)
WBC: 7.5 10*3/uL (ref 4.0–10.5)
nRBC: 0 % (ref 0.0–0.2)

## 2019-08-04 LAB — COMPREHENSIVE METABOLIC PANEL
ALT: 34 U/L (ref 0–44)
AST: 32 U/L (ref 15–41)
Albumin: 4.4 g/dL (ref 3.5–5.0)
Alkaline Phosphatase: 50 U/L (ref 38–126)
Anion gap: 9 (ref 5–15)
BUN: 16 mg/dL (ref 6–20)
CO2: 24 mmol/L (ref 22–32)
Calcium: 9 mg/dL (ref 8.9–10.3)
Chloride: 107 mmol/L (ref 98–111)
Creatinine, Ser: 0.86 mg/dL (ref 0.61–1.24)
GFR calc Af Amer: >60 mL/min (ref >60)
GFR calc non Af Amer: >60 mL/min (ref >60)
Glucose, Bld: 101 mg/dL — ABNORMAL HIGH (ref 70–99)
Potassium: 3.8 mmol/L (ref 3.5–5.1)
Sodium: 140 mmol/L (ref 135–145)
Total Bilirubin: 1.1 mg/dL (ref 0.3–1.2)
Total Protein: 7.6 g/dL (ref 6.5–8.1)

## 2019-08-04 LAB — TROPONIN I (HIGH SENSITIVITY)
Troponin I (High Sensitivity): 6 ng/L (ref <18)
Troponin I (High Sensitivity): 5 ng/L (ref <18)

## 2019-08-04 LAB — LACTATE DEHYDROGENASE: LDH: 238 U/L — ABNORMAL HIGH (ref 98–192)

## 2019-08-04 LAB — FERRITIN: Ferritin: 148 ng/mL (ref 24–336)

## 2019-08-04 LAB — FIBRIN DERIVATIVES D-DIMER (ARMC ONLY): Fibrin derivatives D-dimer (ARMC): 266.15 ng/mL (FEU) (ref 0.00–499.00)

## 2019-08-04 LAB — C-REACTIVE PROTEIN: CRP: <0.5 mg/dL (ref <1.0)

## 2019-08-04 MED ORDER — MORPHINE SULFATE (PF) 4 MG/ML IV SOLN
4.0000 mg | Freq: Once | INTRAVENOUS | Status: AC
  Administered 2019-08-04: 08:00:00 4 mg via INTRAVENOUS
  Filled 2019-08-04: qty 1

## 2019-08-04 MED ORDER — PREDNISONE 10 MG (21) PO TBPK: ORAL_TABLET | ORAL | 0 refills | Status: DC

## 2019-08-04 MED ORDER — ONDANSETRON HCL 4 MG/2ML IJ SOLN
4.0000 mg | Freq: Once | INTRAMUSCULAR | Status: AC
  Administered 2019-08-04: 08:00:00 4 mg via INTRAVENOUS
  Filled 2019-08-04: qty 2

## 2019-08-04 NOTE — Discharge Instructions (Addendum)
Thank you for letting us take care of you in the emergency department today.   Please continue to take any regular, prescribed medications.   New medications we have prescribed:  - Steroid burst  Please follow up with: - Your cardiology doctor to review your ER visit and follow up on your symptoms.   Please return to the ER for any new or worsening symptoms.

## 2019-08-04 NOTE — ED Provider Notes (Signed)
[facility] Emergency Department Provider Note  ____________________________________________   First MD Initiated Contact with Patient 08/04/19 [phone]     (approximate)  I have reviewed the triage vital signs and the nursing notes.  History  Chief Complaint Chest Pain and Shortness of Breath    HPI Seth Parker is a 34 y.o. male COVID positive (1/18), hx HTN, family hx CAD, who presents to the emergency department for an episode of chest pain.  Patient states he recently came out of his COVID quarantine, was feeling well, worked an entire shift without issue.  Today at the end of his shift he developed sudden onset of sharp, severe chest pain. Describes is as a pressure/discomfort. Located just right of the sternum.  No radiation.  Seem to be worsened by coughing, but still present when not coughing as well.  Associated with some shortness of breath and dry heaves as well.  Took 324 mg ASA prior to arrival.  Reports some improvement in his symptoms, currently mild in severity.  No SOB currently.  Denies any history of similar symptoms.  No history of leg swelling, hemoptysis, VTE.  No personal history of CAD.  Father with MI in his early 21s.   Past Medical Hx Past Medical History:  Diagnosis Date  . Anxiety   . Depression   . Family history of premature CAD    a. in father with CAD s/p CABG in early 54s  . GERD (gastroesophageal reflux disease)   . Hyperlipidemia   . Hypertension   . Lumbar spinal stenosis    herniated discs  . Morbid obesity ([place])   . Pituitary adenoma ([place])   . Sleep apnea    uses cpap some nights set oon 7   . Spinal stenosis   . Tobacco abuse    a. dipping    Problem List Patient Active Problem List   Diagnosis Date Noted  . HNP (herniated nucleus pulposus), lumbar 01/18/2017  . 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 Lumbar herniated disc 08/30/2016  . Unstable angina ([place]) 03/04/2015  . Essential hypertension  08/18/2014  . Stenosis, spinal, lumbar 11/19/2013    Past Surgical Hx Past Surgical History:  Procedure Laterality Date  . colonscopy  2-3 yrs ago  . EYE SURGERY Left    secondary to trauma as a child  . LUMBAR LAMINECTOMY/DECOMPRESSION MICRODISCECTOMY N/A 01/18/2017   Procedure: Microlumbar decompression L2-3;  Surgeon: Susa Day, MD;  Location: [facility];  Service: Orthopedics;  Laterality: N/A;  120 Mins    Medications Prior to Admission medications   Medication Sig Start Date End Date Taking? Authorizing Provider  azithromycin (ZITHROMAX) 250 MG tablet 2 tabs (500 mg) x 1 day, then 1 tab (250 mg) x 4 days. 07/22/19   Karen Kitchens, NP  chlorpheniramine-HYDROcodone [facility] PENNKINETIC ER) 10-8 MG/5ML SUER Take 5 mLs by mouth every 12 (twelve) hours as needed for cough. 07/22/19   Karen Kitchens, NP  lisinopril-hydrochlorothiazide (PRINZIDE,ZESTORETIC) 10-12.5 MG tablet TAKE 1 TABLET BY MOUTH DAILY. 11/18/16 07/22/19  Versie Starks, PA-C    Allergies Patient has no known allergies.  Family Hx Family History  Problem Relation Age of Onset  . Heart disease Father   . Heart attack Father 32  . Hypertension Father   . Hyperlipidemia Father   . Mental illness Father        bipolar  . Hypertension Mother   . Cancer Maternal Aunt        breast  .  Cancer Maternal Uncle 50       colon  . Cancer Maternal Grandmother     Social Hx Social History   Tobacco Use  . Smoking status: Never Smoker  . Smokeless tobacco: Current User    Types: Chew  Substance Use Topics  . Alcohol use: Yes  . Drug use: No     Review of Systems  Constitutional: Negative for fever, chills. Eyes: Negative for visual changes. ENT: Negative for sore throat. Cardiovascular: + for chest pain. Respiratory: + for shortness of breath. Gastrointestinal: + for nausea, dry heaves.  Genitourinary: Negative for dysuria. Musculoskeletal: Negative for leg swelling. Skin: Negative for  rash. Neurological: Negative for headaches.   Physical Exam  Vital Signs: ED Triage Vitals  Enc Vitals Group     BP 08/04/19 0728 (!) 164/112     Pulse Rate 08/04/19 0728 77     Resp 08/04/19 0728 20     Temp 08/04/19 0728 98.4 F (36.9 C)     Temp Source 08/04/19 0728 Oral     SpO2 08/04/19 0728 96 %     Weight 08/04/19 0731 290 lb (131.5 kg)     Height 08/04/19 0731 5\' 10"  (1.778 m)     Head Circumference --      Peak Flow --      Pain Score 08/04/19 0729 2     Pain Loc --      Pain Edu? --      Excl. in [place]? --     Constitutional: Alert and oriented.  Head: Normocephalic. Atraumatic. Eyes: Conjunctivae clear. Sclera anicteric. Nose: No congestion. No rhinorrhea. Mouth/Throat: Wearing mask.  Neck: No stridor.   Cardiovascular: Normal rate, regular rhythm. No rub. Extremities well perfused. Respiratory: Normal respiratory effort.  Lungs CTAB. Gastrointestinal: Soft. Non-tender. Non-distended.  Musculoskeletal: No lower extremity edema. No deformities. Neurologic:  Normal speech and language. No gross focal neurologic deficits are appreciated.  Skin: Skin is warm, dry and intact. No rash noted. Psychiatric: Mood and affect are appropriate for situation.  EKG  Personally reviewed.   Rate: 81 Rhythm: sinus Axis: normal Intervals: WNL Multiple PVCs Inferior Q waves No STEMI    Radiology  XR: IMPRESSION:  Bibasilar heterogeneous opacities may represent atelectasis.    Procedures  Procedure(s) performed (including critical care):  Procedures   Initial Impression / Assessment and Plan / ED Course  34 y.o. male who presents to the ED for an episode of chest pain, shortness of breath.  This in the setting of COVID positive on 1/18.  Ddx: ACS, PE, pneumothorax, pleurisy, pericarditis  Will obtain labs, imaging, reassess.  Received ASA prior to arrival.  D-dimer negative. Troponin x 2 negative and in fact down trended. EKG as above. XR negative for PTX  or consolidation. Suspect likely some post viral pleurisy component. Will plan for course of steroid taper, and also advised cardiology follow up. He has actually already been working to see Dr. Fletcher Anon, encouraged this. Otherwise, given negative work up, patient stable for discharge. Patient agreeable. Given return precautions.   Final Clinical Impression(s) / ED Diagnosis  Final diagnoses:  Chest pain       Note:  This document was prepared using Dragon voice recognition software and may include unintentional dictation errors.   Lilia Pro., MD 08/04/19 [phone]

## 2019-08-04 NOTE — ED Notes (Addendum)
After pt received zofran he complained of severe pain throughout top half of body that "shot through " his head - pt reportsed that he felt like his body was on fire - pt became diaphoretic - after 2-3 min pt reports that pain was resolving - after another 1-2 minutes pt stated that pain had completely resolved - no increased SHOB/CP and no hives noted - morphine was then administered - Dr Royden Purl made aware

## 2019-08-04 NOTE — ED Triage Notes (Signed)
Pt arrived via EMS for report of chest pain and SHOB that started approx 30 min ago - pt tested covid+ 2 weeks ago and returned to work yesterday - today he started with sudden onset of chest pain/heaviness and cough with [facility]

## 2019-08-29 ENCOUNTER — Encounter: Payer: Self-pay | Admitting: Cardiovascular Disease

## 2019-08-29 ENCOUNTER — Ambulatory Visit (INDEPENDENT_AMBULATORY_CARE_PROVIDER_SITE_OTHER): Payer: Managed Care, Other (non HMO) | Admitting: Cardiovascular Disease

## 2019-08-29 ENCOUNTER — Other Ambulatory Visit: Payer: Self-pay

## 2019-08-29 VITALS — BP 150/90 | HR 75 | Ht 70.0 in | Wt 289.0 lb

## 2019-08-29 DIAGNOSIS — I10 Essential (primary) hypertension: Secondary | ICD-10-CM | POA: Diagnosis not present

## 2019-08-29 DIAGNOSIS — R0602 Shortness of breath: Secondary | ICD-10-CM | POA: Diagnosis not present

## 2019-08-29 DIAGNOSIS — I493 Ventricular premature depolarization: Secondary | ICD-10-CM | POA: Diagnosis not present

## 2019-08-29 DIAGNOSIS — R072 Precordial pain: Secondary | ICD-10-CM

## 2019-08-29 MED ORDER — METOPROLOL TARTRATE 100 MG PO TABS: ORAL_TABLET | ORAL | 0 refills | Status: DC

## 2019-08-29 NOTE — Progress Notes (Signed)
Cardiology Office Note   Date:  08/29/2019   ID:  Seth Parker, DOB [birth year], MRN [id number]  PCP:  Seth Parker  Cardiologist:   Seth Sacramento, MD   Chief Complaint  Patient presents with  . New Patient (Initial Visit)    Pt f/u ED. pt states f/u w/ chest pain. pt concern w/ PVCs. Pt concern w/ BP. Meds verbally reviewed w/ pt.      History of Present Illness: Seth Parker is a 34 y.o. male who was referred by Seth Parker for evaluation of chest pain. He has no previous cardiac history. He has known history of hypertension , sleep apnea currently  using CPAP, morbid obesity and tobacco dipping. He does have family history of premature coronary artery disease and his father is actually one of my patients.  He was seen by me in 2016 for atypical chest pain and exertional dyspnea. He underwent a treadmill stress test which showed no evidence of ischemia. There was evidence of physical deconditioning and hypertensive response to exercise. Echocardiogram was unremarkable except for mild left ventricular hypertrophy. He had COVID-19 infection in January that did not require hospitalization.  He developed chest pain on January 31.  It was sudden onset severe pressure on the right side of the sternum with no radiation.  It worsened with coughing and was associated with shortness of breath and dry heaves.  He went to the emergency room for evaluation.  D-dimer and troponin were in the normal range.  Chest x-ray showed no evidence of pneumonia.  It was suspected of having viral pleurisy.  He was treated with a tapering course of prednisone.  I personally reviewed his EKG that was done and showed sinus rhythm with PVCs and small inferior Q waves.  His chest pain resolved after about 1 week of treatment.  However, he continued to have PVCs sometimes in the form of bigeminy.  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  In addition, he noticed worsening exertional dyspnea.  This is most  noticeable to him during sex and is almost debilitating. He does have known history of sleep apnea and uses his CPAP on a regular basis.  He also has history of essential hypertension and used to be on lisinopril-hydrochlorothiazide but he stopped the medication due to dizziness and concerns of low blood pressure.  He had back surgery in 2018. His father's coronary artery disease started at the age of 28.    Past Medical History:  Diagnosis Date  . Anxiety   . Depression   . Family history of premature CAD    a. in father with CAD s/p CABG in early 64s  . GERD (gastroesophageal reflux disease)   . Hyperlipidemia   . Hypertension   . Lumbar spinal stenosis    herniated discs  . Morbid obesity ([place])   . Pituitary adenoma ([place])   . Sleep apnea    uses cpap some nights set oon 7   . Spinal stenosis   . Tobacco abuse    a. dipping    Past Surgical History:  Procedure Laterality Date  . colonscopy  2-3 yrs ago  . EYE SURGERY Left    secondary to trauma as a child  . LUMBAR LAMINECTOMY/DECOMPRESSION MICRODISCECTOMY N/A 01/18/2017   Procedure: Microlumbar decompression L2-3;  Surgeon: Seth Day, MD;  Location: [facility];  Service: Orthopedics;  Laterality: N/A;  120 Mins     No current outpatient medications on  file.   No current facility-administered medications for this visit.    Allergies:   Patient has no known allergies.    Social History:  The patient  reports that he has been smoking. He has a 18.00 pack-year smoking history. His smokeless tobacco use includes chew. He reports current alcohol use. He reports that he does not use drugs.   Family History:  The patient's family history includes Cancer in his maternal aunt and maternal grandmother; Cancer (age of onset: 39) in his maternal uncle; Heart attack (age of onset: 1) in his father; Heart disease in his father; Hyperlipidemia in his father; Hypertension in his father and mother; Mental illness in his father.     ROS:  Please see the history of present illness.   Otherwise, review of systems are positive for none.   All other systems are reviewed and negative.    PHYSICAL EXAM: VS:  BP (!) 150/90 (BP Location: Right Arm, Patient Position: Sitting, Cuff Size: Large)   Pulse 75   Ht 5\' 10"  (1.778 m)   Wt 289 lb (131.1 kg)   SpO2 98%   BMI 41.47 kg/m  , BMI Body mass index is 41.47 kg/m. GEN: Well nourished, well developed, in no acute distress  HEENT: normal  Neck: no JVD, carotid bruits, or masses Cardiac: RRR; no murmurs, rubs, or gallops,no edema  Respiratory:  clear to auscultation bilaterally, normal work of breathing GI: soft, nontender, nondistended, + BS MS: no deformity or atrophy  Skin: warm and dry, no rash Neuro:  Strength and sensation are intact Psych: euthymic mood, full affect   EKG:  EKG is ordered today. The ekg ordered today demonstrates normal sinus rhythm with no significant kyphosis or T wave changes.   Recent Labs: 08/04/2019: ALT 34; BUN 16; Creatinine, Ser 0.86; Hemoglobin 15.8; Platelets 277; Potassium 3.8; Sodium 140    Lipid Panel    Component Value Date/Time   CHOL 164 12/23/2016 1050   TRIG 93 12/23/2016 1050   HDL 33 (L) 12/23/2016 1050   CHOLHDL 5.0 12/23/2016 1050   CHOLHDL 6.0 03/04/2015 1843   VLDL 35 03/04/2015 1843   LDLCALC 112 (H) 12/23/2016 1050      Wt Readings from Last 3 Encounters:  08/29/19 289 lb (131.1 kg)  08/04/19 290 lb (131.5 kg)  07/22/19 290 lb (131.5 kg)        PAD Screen 08/29/2019  Previous PAD dx? No  Previous surgical procedure? No  Pain with walking? No  Feet/toe relief with dangling? No  Painful, non-healing ulcers? No  Extremities discolored? No      ASSESSMENT AND PLAN:  1.  Atypical chest pain: I agree that the chest pain is atypical and likely was due to pleurisy.  It improved significantly with a course of prednisone.  However, he is experiencing significant exertional dyspnea post Covid  infection in January.  He has multiple risk factors for coronary artery disease and thus I recommend evaluation with CTA of the coronary arteries with FFR.  2.  Exertional dyspnea: I requested an echocardiogram to evaluate ejection fraction to ensure no signs of cardiomyopathy post Covid infection.  3.  PVCs: He seems to be having frequent symptomatic PVCs especially when he is under stress at work.  We will have to rule out an ischemic etiology and evaluate ejection fraction and then determine if this needs to be treated.  Treatment with a calcium channel blocker or beta-blocker might be indicated to help with this and his  blood pressure.  4.  Essential hypertension: His blood pressure has been elevated lately.  He is no longer taking lisinopril-hydrochlorothiazide.  We will readdress this after his cardiac studies.  5.  Obesity: We will have to address healthy lifestyle changes and will follow up on this with him after his cardiac studies.  He does not smoke but he continues to dip.    Disposition:   FU with me in 2 months  Signed,  Seth Sacramento, MD  08/29/2019 9:12 AM    [place]

## 2019-08-29 NOTE — Patient Instructions (Signed)
Medication Instructions:  Your physician recommends that you continue on your current medications as directed. Please refer to the Current Medication list given to you today.  A one tome dose of Metoprolol 100 mg has been sent to your pharmacy. Take 2 hours prior to your Cardiac CTA  *If you need a refill on your cardiac medications before your next appointment, please call your pharmacy*  Lab Work: You will need lab work (bmet) prior to the [place]. Please have your lab drawn at the [facility] medical mall.  You do not need an appointment. Their hours are Mon-Fri 7am-6pm.  If you have labs (blood work) drawn today and your tests are completely normal, you will receive your results only by: Marland Kitchen MyChart Message (if you have MyChart) OR . A paper copy in the mail If you have any lab test that is abnormal or we need to change your treatment, we will call you to review the results.  Testing/Procedures: Your physician has requested that you have an echocardiogram. Echocardiography is a painless test that uses sound waves to create images of your heart. It provides your doctor with information about the size and shape of your heart and how well your heart's chambers and valves are working. This procedure takes approximately one hour. There are no restrictions for this procedure.  Your physician has requested that you have cardiac CT. Cardiac computed tomography (CT) is a painless test that uses an x-ray machine to take clear, detailed pictures of your heart. For further information please visit [facility]. Please follow instruction sheet as given.     Follow-Up: At [facility], you and your health needs are our priority.  As part of our continuing mission to provide you with exceptional heart care, we have created designated Provider Care Teams.  These Care Teams include your primary Cardiologist (physician) and Advanced Practice Providers (APPs -  Physician Assistants and Nurse Practitioners) who  all work together to provide you with the care you need, when you need it.  Your next appointment:   2 month(s)  The format for your next appointment:   In Person  Provider:    You may see  Dr. Fletcher Anon or one of the following Advanced Practice Providers on your designated Care Team:    Murray Hodgkins, NP  Christell Faith, PA-C  Marrianne Mood, PA-C   Other Instructions Your cardiac CT will be scheduled at one of the below locations:   [facility] [address] [place], [place] [address] [phone]  [employer] [address] [place], [place] [address] [phone]  If scheduled at [facility], please arrive at the [facility] main entrance of [facility] 30 minutes prior to test start time. Proceed to the [facility] Radiology Department (first floor) to check-in and test prep.  If scheduled at [facility], please arrive 15 mins early for check-in and test prep.  Please follow these instructions carefully (unless otherwise directed):  Hold all erectile dysfunction medications at least 3 days (72 hrs) prior to test.  On the Night Before the Test: . Be sure to Drink plenty of water. . Do not consume any caffeinated/decaffeinated beverages or chocolate 12 hours prior to your test. . Do not take any antihistamines 12 hours prior to your test.  On the Day of the Test: . Drink plenty of water. Do not drink any water within one hour of the test. . Do not eat  any food 4 hours prior to the test. . You may take your regular medications prior to the test.  . Take metoprolol (Lopressor) two hours prior to test. . HOLD Furosemide/Hydrochlorothiazide morning of the test.      After the Test: . Drink plenty of water. . After receiving IV contrast, you may experience a mild flushed feeling. This is normal. . On occasion, you may experience a mild rash up to 24 hours after  the test. This is not dangerous. If this occurs, you can take Benadryl 25 mg and increase your fluid intake. . If you experience trouble breathing, this can be serious. If it is severe call 911 IMMEDIATELY. If it is mild, please call our office. . If you take any of these medications: Glipizide/Metformin, Avandament, Glucavance, please do not take 48 hours after completing test unless otherwise instructed.   Once we have confirmed authorization from your insurance company, we will call you to set up a date and time for your test.   For non-scheduling related questions, please contact the cardiac imaging nurse navigator should you have any questions/concerns: Marchia Bond, RN Navigator Cardiac Imaging [facility] [phone] mobile

## 2019-09-02 ENCOUNTER — Ambulatory Visit (INDEPENDENT_AMBULATORY_CARE_PROVIDER_SITE_OTHER): Payer: Managed Care, Other (non HMO)

## 2019-09-02 ENCOUNTER — Other Ambulatory Visit: Payer: Self-pay

## 2019-09-02 DIAGNOSIS — R072 Precordial pain: Secondary | ICD-10-CM | POA: Diagnosis not present

## 2019-09-02 DIAGNOSIS — R0602 Shortness of breath: Secondary | ICD-10-CM

## 2019-09-02 MED ORDER — PERFLUTREN LIPID MICROSPHERE
1.0000 mL | INTRAVENOUS | Status: AC | PRN
  Administered 2019-09-02: 2 mL via INTRAVENOUS

## 2019-09-06 ENCOUNTER — Telehealth: Payer: Self-pay

## 2019-09-06 NOTE — Telephone Encounter (Signed)
Patient returned the call and made aware of results with verbalized understanding.

## 2019-09-06 NOTE — Telephone Encounter (Signed)
-----   Message from Wellington Hampshire, MD sent at 09/06/2019  8:37 AM EST ----- Inform patient that echo was fine.

## 2019-09-06 NOTE — Telephone Encounter (Signed)
Called to give the patient echo results. DPR on file. lmom with results. Patient is to contact the office if any questions.. 

## 2019-09-12 ENCOUNTER — Other Ambulatory Visit: Payer: Self-pay

## 2019-09-12 MED ORDER — METOPROLOL TARTRATE 100 MG PO TABS: ORAL_TABLET | ORAL | 0 refills | Status: DC

## 2019-09-12 NOTE — Telephone Encounter (Signed)
Patients Rx for one time 100 mg Metoprolol to be taken to hours prior to his [place] CTA. Patient needed another prescription sent in to  [facility]  they cancelled the other one because it was not picked up in a timely manner

## 2019-09-18 ENCOUNTER — Telehealth (HOSPITAL_COMMUNITY): Payer: Self-pay | Admitting: Emergency Medicine

## 2019-09-18 ENCOUNTER — Encounter (HOSPITAL_COMMUNITY): Payer: Self-pay

## 2019-09-18 NOTE — Telephone Encounter (Signed)
Reaching out to patient to offer assistance regarding upcoming cardiac imaging study; pt verbalizes understanding of appt date/time, parking situation and where to check in, pre-test NPO status and medications ordered, and verified current allergies; name and call back number provided for further questions should they arise Jeremyah Jelley RN Navigator Cardiac Imaging [facility] [phone] office [phone] cell 

## 2019-09-19 ENCOUNTER — Ambulatory Visit
Admission: RE | Admit: 2019-09-19 | Discharge: 2019-09-19 | Disposition: A | Discharge status: Home / Self Care | Payer: Managed Care, Other (non HMO) | Source: Ambulatory Visit | Attending: Cardiovascular Disease | Admitting: Cardiovascular Disease

## 2019-09-19 ENCOUNTER — Other Ambulatory Visit: Payer: Self-pay

## 2019-09-19 DIAGNOSIS — R072 Precordial pain: Secondary | ICD-10-CM | POA: Diagnosis not present

## 2019-09-19 MED ORDER — NITROGLYCERIN 0.4 MG SL SUBL: 0.4000 mg | SUBLINGUAL_TABLET | SUBLINGUAL | Status: DC | PRN

## 2019-09-19 MED ORDER — NITROGLYCERIN 0.4 MG SL SUBL
0.8000 mg | SUBLINGUAL_TABLET | Freq: Once | SUBLINGUAL | Status: AC
  Administered 2019-09-19: 11:00:00 0.8 mg via SUBLINGUAL

## 2019-09-19 MED ORDER — METOPROLOL TARTRATE 5 MG/5ML IV SOLN
10.0000 mg | INTRAVENOUS | Status: DC | PRN
  Administered 2019-09-19: 11:00:00 10 mg via INTRAVENOUS

## 2019-09-19 MED ORDER — IOHEXOL 350 MG/ML SOLN
125.0000 mL | Freq: Once | INTRAVENOUS | Status: AC | PRN
  Administered 2019-09-19: 130 mL via INTRAVENOUS

## 2019-09-19 MED ORDER — IOHEXOL 350 MG/ML SOLN: 125.0000 mL | Freq: Once | INTRAVENOUS | Status: DC | PRN

## 2019-09-20 ENCOUNTER — Telehealth: Payer: Self-pay

## 2019-09-20 NOTE — Telephone Encounter (Signed)
-----   Message from Wellington Hampshire, MD sent at 09/20/2019 11:31 AM EDT ----- Normal cardiac CTA.  No evidence of atherosclerosis.  This is excellent news.

## 2019-09-20 NOTE — Telephone Encounter (Signed)
Spoke with patient regarding results.  Patient verbalizes understanding and is agreeable to plan of care. Advised patient to call back with any issues or concerns.  

## 2019-09-26 ENCOUNTER — Ambulatory Visit: Payer: Managed Care, Other (non HMO)

## 2019-10-29 ENCOUNTER — Other Ambulatory Visit: Payer: Self-pay

## 2019-10-29 ENCOUNTER — Ambulatory Visit (INDEPENDENT_AMBULATORY_CARE_PROVIDER_SITE_OTHER): Payer: Managed Care, Other (non HMO) | Admitting: Cardiovascular Disease

## 2019-10-29 ENCOUNTER — Encounter: Payer: Self-pay | Admitting: Cardiovascular Disease

## 2019-10-29 VITALS — BP 130/100 | HR 87 | Ht 70.0 in | Wt 288.0 lb

## 2019-10-29 DIAGNOSIS — I10 Essential (primary) hypertension: Secondary | ICD-10-CM

## 2019-10-29 DIAGNOSIS — R072 Precordial pain: Secondary | ICD-10-CM

## 2019-10-29 DIAGNOSIS — I493 Ventricular premature depolarization: Secondary | ICD-10-CM

## 2019-10-29 DIAGNOSIS — R0602 Shortness of breath: Secondary | ICD-10-CM

## 2019-10-29 NOTE — Progress Notes (Signed)
Cardiology Office Note   Date:  10/29/2019   ID:  [name], DOB 06-Apr-1986, MRN [id number]  PCP:  Patient, No Pcp Per  Cardiologist:   Kathlyn Sacramento, MD   Chief Complaint  Patient presents with  . OTHER    2 month f/u no complaints today. Meds reviewed verbally with pt.      History of Present Illness: Seth Parker is a 34 y.o. male who is here today for follow-up visit regarding recent chest pain.   He has known history of hypertension , sleep apnea currently  using CPAP, morbid obesity and tobacco dipping. He does have family history of premature coronary artery disease and his father is actually one of my patients.  He had COVID-19 infection in January that did not require hospitalization.  He did have intermittent episodes of chest pain after the one of them required an emergency room visit.  D-dimer and troponin were in the normal range.  Chest x-ray showed no evidence of pneumonia.  It was suspected of having viral pleurisy.  He was treated with a tapering course of prednisone.  He had intermittent palpitations since that time thought to be due to PVCs. He does have known history of sleep apnea and uses his CPAP on a regular basis.  He also has history of essential hypertension and used to be on lisinopril-hydrochlorothiazide but he stopped the medication due to dizziness and concerns of low blood pressure.  He had back surgery in 2018. I proceeded with CTA of the coronary arteries which showed calcium score of 0 with no evidence of obstructive disease.  Echocardiogram showed normal LV systolic function, mild left ventricular hypertrophy no significant valvular abnormalities.  Has been doing well overall with no recurrent chest pain or shortness of breath.  He has occasional palpitations.    Past Medical History:  Diagnosis Date  . Anxiety   . Depression   . Family history of premature CAD    a. in father with CAD s/p CABG in early 23s  . GERD (gastroesophageal  reflux disease)   . Hyperlipidemia   . Hypertension   . Lumbar spinal stenosis    herniated discs  . Morbid obesity ([place])   . Pituitary adenoma ([place])   . Sleep apnea    uses cpap some nights set oon 7   . Spinal stenosis   . Tobacco abuse    a. dipping    Past Surgical History:  Procedure Laterality Date  . colonscopy  2-3 yrs ago  . EYE SURGERY Left    secondary to trauma as a child  . LUMBAR LAMINECTOMY/DECOMPRESSION MICRODISCECTOMY N/A 01/18/2017   Procedure: Microlumbar decompression L2-3;  Surgeon: Susa Day, MD;  Location: [facility];  Service: Orthopedics;  Laterality: N/A;  120 Mins     No current outpatient medications on file.   No current facility-administered medications for this visit.    Allergies:   Patient has no known allergies.    Social History:  The patient  reports that he has been smoking. He has a 18.00 pack-year smoking history. His smokeless tobacco use includes chew. He reports current alcohol use. He reports that he does not use drugs.   Family History:  The patient's family history includes Cancer in his maternal aunt and maternal grandmother; Cancer (age of onset: 11) in his maternal uncle; Heart attack (age of onset: 14) in his father; Heart disease in his father; Hyperlipidemia in his father; Hypertension in his father and  mother; Mental illness in his father.    ROS:  Please see the history of present illness.   Otherwise, review of systems are positive for none.   All other systems are reviewed and negative.    PHYSICAL EXAM: VS:  BP (!) 130/100 (BP Location: Left Arm, Patient Position: Sitting, Cuff Size: Large)   Pulse 87   Ht 5\' 10"  (1.778 m)   Wt 288 lb (130.6 kg)   SpO2 98%   BMI 41.32 kg/m  , BMI Body mass index is 41.32 kg/m. GEN: Well nourished, well developed, in no acute distress  HEENT: normal  Neck: no JVD, carotid bruits, or masses Cardiac: RRR; no murmurs, rubs, or gallops,no edema  Respiratory:  clear to auscultation  bilaterally, normal work of breathing GI: soft, nontender, nondistended, + BS MS: no deformity or atrophy  Skin: warm and dry, no rash Neuro:  Strength and sensation are intact Psych: euthymic mood, full affect   EKG:  EKG is ordered today. The ekg ordered today demonstrates normal sinus rhythm with no significant ST or T wave changes.   Recent Labs: 08/04/2019: ALT 34; BUN 16; Creatinine, Ser 0.86; Hemoglobin 15.8; Platelets 277; Potassium 3.8; Sodium 140    Lipid Panel    Component Value Date/Time   CHOL 164 12/23/2016 1050   TRIG 93 12/23/2016 1050   HDL 33 (L) 12/23/2016 1050   CHOLHDL 5.0 12/23/2016 1050   CHOLHDL 6.0 03/04/2015 1843   VLDL 35 03/04/2015 1843   LDLCALC 112 (H) 12/23/2016 1050      Wt Readings from Last 3 Encounters:  10/29/19 288 lb (130.6 kg)  08/29/19 289 lb (131.1 kg)  08/04/19 290 lb (131.5 kg)        PAD Screen 08/29/2019  Previous PAD dx? No  Previous surgical procedure? No  Pain with walking? No  Feet/toe relief with dangling? No  Painful, non-healing ulcers? No  Extremities discolored? No      ASSESSMENT AND PLAN:  1.  Atypical noncardiac chest pain: I reviewed results of CTA of the coronary arteries with him.  It showed 0 calcium score and no evidence of coronary artery disease.  This is very reassuring.  No further cardiac work-up is recommended.    2.  Exertional dyspnea: Echocardiogram was unremarkable.  Likely a component of physical deconditioning.  3.  PVCs: Seem to be improving overall.  His EKG and physical exam does not reveal any premature beats today.  4.  Essential hypertension: Blood pressure is improved since last visit but still not controlled.  I discussed with him the options of resuming some antihypertensive medication.  He wants to continue with lifestyle changes and low-sodium diet as he has been able to lose more than 20 pounds over the last year.  I advised him to establish with a primary care physician to  keep follow-up on these chronic issues.   Disposition:   FU with me  as needed.  Signed,  Kathlyn Sacramento, MD  10/29/2019 3:22 PM    [facility]

## 2019-10-29 NOTE — Patient Instructions (Addendum)
Medication Instructions:  Your physician recommends that you continue on your current medications as directed. Please refer to the Current Medication list given to you today.  *If you need a refill on your cardiac medications before your next appointment, please call your pharmacy*   Lab Work: None ordered If you have labs (blood work) drawn today and your tests are completely normal, you will receive your results only by: . MyChart Message (if you have MyChart) OR . A paper copy in the mail If you have any lab test that is abnormal or we need to change your treatment, we will call you to review the results.   Testing/Procedures: None ordered   Follow-Up: At [facility], you and your health needs are our priority.  As part of our continuing mission to provide you with exceptional heart care, we have created designated Provider Care Teams.  These Care Teams include your primary Cardiologist (physician) and Advanced Practice Providers (APPs -  Physician Assistants and Nurse Practitioners) who all work together to provide you with the care you need, when you need it.  We recommend signing up for the patient portal called "MyChart".  Sign up information is provided on this After Visit Summary.  MyChart is used to connect with patients for Virtual Visits (Telemedicine).  Patients are able to view lab/test results, encounter notes, upcoming appointments, etc.  Non-urgent messages can be sent to your provider as well.   To learn more about what you can do with MyChart, go to [email or web address].    Your next appointment:   As needed   The format for your next appointment:   In Person  Provider:    You may see  Dr. Arida or one of the following Advanced Practice Providers on your designated Care Team:    Christopher Berge, NP  Ryan Dunn, PA-C  Jacquelyn Visser, PA-C    Other Instructions N/A  

## 2020-04-14 DIAGNOSIS — R002 Palpitations: Secondary | ICD-10-CM

## 2020-04-15 ENCOUNTER — Other Ambulatory Visit
Admission: RE | Admit: 2020-04-15 | Discharge: 2020-04-15 | Disposition: A | Discharge status: Home / Self Care | Payer: Self-pay | Source: Ambulatory Visit | Attending: Cardiovascular Disease | Admitting: Cardiovascular Disease

## 2020-04-15 DIAGNOSIS — R002 Palpitations: Secondary | ICD-10-CM | POA: Insufficient documentation

## 2020-04-15 LAB — CBC WITH DIFFERENTIAL/PLATELET
Abs Immature Granulocytes: 0.02 10*3/uL (ref 0.00–0.07)
Basophils Absolute: 0 10*3/uL (ref 0.0–0.1)
Basophils Relative: 1 %
Eosinophils Absolute: 0.1 10*3/uL (ref 0.0–0.5)
Eosinophils Relative: 2 %
HCT: 43.5 % (ref 39.0–52.0)
Hemoglobin: 14.9 g/dL (ref 13.0–17.0)
Immature Granulocytes: 0 %
Lymphocytes Relative: 31 %
Lymphs Abs: 2.1 10*3/uL (ref 0.7–4.0)
MCH: 29.6 pg (ref 26.0–34.0)
MCHC: 34.3 g/dL (ref 30.0–36.0)
MCV: 86.3 fL (ref 80.0–100.0)
Monocytes Absolute: 0.5 10*3/uL (ref 0.1–1.0)
Monocytes Relative: 8 %
Neutro Abs: 4 10*3/uL (ref 1.7–7.7)
Neutrophils Relative %: 58 %
Platelets: 222 10*3/uL (ref 150–400)
RBC: 5.04 MIL/uL (ref 4.22–5.81)
RDW: 11.8 % (ref 11.5–15.5)
WBC: 6.7 10*3/uL (ref 4.0–10.5)
nRBC: 0 % (ref 0.0–0.2)

## 2020-04-15 LAB — TSH: TSH: 2.378 u[IU]/mL (ref 0.350–4.500)

## 2020-04-15 LAB — BASIC METABOLIC PANEL
Anion gap: 11 (ref 5–15)
BUN: 11 mg/dL (ref 6–20)
CO2: 25 mmol/L (ref 22–32)
Calcium: 9 mg/dL (ref 8.9–10.3)
Chloride: 104 mmol/L (ref 98–111)
Creatinine, Ser: 0.9 mg/dL (ref 0.61–1.24)
GFR, Estimated: >60 mL/min (ref >60)
Glucose, Bld: 94 mg/dL (ref 70–99)
Potassium: 3.7 mmol/L (ref 3.5–5.1)
Sodium: 140 mmol/L (ref 135–145)

## 2020-04-15 LAB — MAGNESIUM: Magnesium: 2.3 mg/dL (ref 1.7–2.4)

## 2020-05-07 ENCOUNTER — Ambulatory Visit (INDEPENDENT_AMBULATORY_CARE_PROVIDER_SITE_OTHER): Payer: Self-pay

## 2020-05-07 DIAGNOSIS — R002 Palpitations: Secondary | ICD-10-CM

## 2020-05-26 ENCOUNTER — Telehealth: Payer: Self-pay

## 2020-05-26 DIAGNOSIS — I472 Ventricular tachycardia: Secondary | ICD-10-CM

## 2020-05-26 DIAGNOSIS — I4729 Other ventricular tachycardia: Secondary | ICD-10-CM

## 2020-05-26 DIAGNOSIS — I493 Ventricular premature depolarization: Secondary | ICD-10-CM

## 2020-05-26 MED ORDER — DILTIAZEM HCL ER COATED BEADS 120 MG PO CP24: 120.0000 mg | ORAL_CAPSULE | Freq: Every day | ORAL | 5 refills | Status: DC

## 2020-05-26 NOTE — Telephone Encounter (Signed)
Patient made aware of cardiac monitor results and Dr. Tyrell Antonio recommendation. Patient is agreeable with the plan. Rx for Diltiazem 120 mg qd has been sent to the patients pharmacy. EP referral placed. Adv the patient that a scheduler will contact him to schedule. Patient verbalized understanding and voiced appreciation for the call.

## 2020-05-26 NOTE — Telephone Encounter (Signed)
Lmov to schedule. Referral in place closing this encounter

## 2020-05-26 NOTE — Telephone Encounter (Signed)
-----   Message from Wellington Hampshire, MD sent at 05/25/2020  1:46 PM EST ----- Monitor showed very frequent PVCs and short runs of nonsustained ventricular tachycardia.  Recommend adding diltiazem extended release 120 mg once daily and refer to EP for evaluation.

## 2020-07-08 ENCOUNTER — Other Ambulatory Visit: Payer: Self-pay

## 2020-07-08 ENCOUNTER — Ambulatory Visit (INDEPENDENT_AMBULATORY_CARE_PROVIDER_SITE_OTHER): Payer: Self-pay | Admitting: Cardiology

## 2020-07-08 ENCOUNTER — Encounter: Payer: Self-pay | Admitting: Cardiology

## 2020-07-08 VITALS — BP 130/90 | HR 78 | Ht 70.0 in | Wt 290.0 lb

## 2020-07-08 DIAGNOSIS — I4729 Other ventricular tachycardia: Secondary | ICD-10-CM

## 2020-07-08 DIAGNOSIS — R0602 Shortness of breath: Secondary | ICD-10-CM

## 2020-07-08 DIAGNOSIS — I493 Ventricular premature depolarization: Secondary | ICD-10-CM

## 2020-07-08 DIAGNOSIS — I472 Ventricular tachycardia: Secondary | ICD-10-CM

## 2020-07-08 MED ORDER — DILTIAZEM HCL ER COATED BEADS 120 MG PO CP24: ORAL_CAPSULE | ORAL | 1 refills | Status: DC

## 2020-07-08 NOTE — Patient Instructions (Addendum)
Medication Instructions:  - Your physician has recommended you make the following change in your medication:   1) INCREASE diltiazem 120 mg- take 1 capsule by mouth twice daily   *If you need a refill on your cardiac medications before your next appointment, please call your pharmacy*   Lab Work: - none ordered  If you have labs (blood work) drawn today and your tests are completely normal, you will receive your results only by: Marland Kitchen MyChart Message (if you have MyChart) OR . A paper copy in the mail If you have any lab test that is abnormal or we need to change your treatment, we will call you to review the results.   Testing/Procedures: - Your physician has requested that you have an echocardiogram. Echocardiography is a painless test that uses sound waves to create images of your heart. It provides your doctor with information about the size and shape of your heart and how well your heart's chambers and valves are working. This procedure takes approximately one hour. There are no restrictions for this procedure. There is a possibility that an IV may need to be started during your test to inject an image enhancing agent. This is done to obtain more optimal pictures of your heart. Therefore we ask that you do at least drink some water prior to coming in to hydrate your veins.     Follow-Up: At [facility], you and your health needs are our priority.  As part of our continuing mission to provide you with exceptional heart care, we have created designated Provider Care Teams.  These Care Teams include your primary Cardiologist (physician) and Advanced Practice Providers (APPs -  Physician Assistants and Nurse Practitioners) who all work together to provide you with the care you need, when you need it.  We recommend signing up for the patient portal called "MyChart".  Sign up information is provided on this After Visit Summary.  MyChart is used to connect with patients for Virtual Visits  (Telemedicine).  Patients are able to view lab/test results, encounter notes, upcoming appointments, etc.  Non-urgent messages can be sent to your provider as well.   To learn more about what you can do with MyChart, go to [email or web address].    Your next appointment:   3 month(s)  The format for your next appointment:   In Person  Provider:   Steffanie Dunn, MD   Other Instructions   Echocardiogram An echocardiogram is a procedure that uses painless sound waves (ultrasound) to produce an image of the heart. Images from an echocardiogram can provide important information about:  Signs of coronary artery disease (CAD).  Aneurysm detection. An aneurysm is a weak or damaged part of an artery wall that bulges out from the normal force of blood pumping through the body.  Heart size and shape. Changes in the size or shape of the heart can be associated with certain conditions, including heart failure, aneurysm, and CAD.  Heart muscle function.  Heart valve function.  Signs of a past heart attack.  Fluid buildup around the heart.  Thickening of the heart muscle.  A tumor or infectious growth around the heart valves. Tell a health care provider about:  Any allergies you have.  All medicines you are taking, including vitamins, herbs, eye drops, creams, and over-the-counter medicines.  Any blood disorders you have.  Any surgeries you have had.  Any medical conditions you have.  Whether you are pregnant or may be pregnant. What are the risks? Generally, this  is a safe procedure. However, problems may occur, including:  Allergic reaction to dye (contrast) that may be used during the procedure. What happens before the procedure? No specific preparation is needed. You may eat and drink normally. What happens during the procedure?   An IV tube may be inserted into one of your veins.  You may receive contrast through this tube. A contrast is an injection that  improves the quality of the pictures from your heart.  A gel will be applied to your chest.  A wand-like tool (transducer) will be moved over your chest. The gel will help to transmit the sound waves from the transducer.  The sound waves will harmlessly bounce off of your heart to allow the heart images to be captured in real-time motion. The images will be recorded on a computer. The procedure may vary among health care providers and hospitals. What happens after the procedure?  You may return to your normal, everyday life, including diet, activities, and medicines, unless your health care provider tells you not to do that. Summary  An echocardiogram is a procedure that uses painless sound waves (ultrasound) to produce an image of the heart.  Images from an echocardiogram can provide important information about the size and shape of your heart, heart muscle function, heart valve function, and fluid buildup around your heart.  You do not need to do anything to prepare before this procedure. You may eat and drink normally.  After the echocardiogram is completed, you may return to your normal, everyday life, unless your health care provider tells you not to do that. This information is not intended to replace advice given to you by your health care provider. Make sure you discuss any questions you have with your health care provider. Document Revised: 10/11/2018 Document Reviewed: 07/23/2016 Elsevier Patient Education  [place].

## 2020-07-08 NOTE — Progress Notes (Signed)
Electrophysiology Office Note:    Date:  07/08/2020   ID:  [name], DOB 26-Jan-1986, MRN [id number]  PCP:  Patient, No Pcp Per  [facility] Cardiologist:  No primary care provider on file.  [facility] Electrophysiologist:  Lanier Prude, MD   Referring MD: Iran Ouch, MD   Chief Complaint: Frequent PVCs  History of Present Illness:    Seth Parker is a 35 y.o. male who presents for an evaluation of frequent PVCs at the request of Dr. Kirke Corin. Their medical history includes tobacco abuse, sleep apnea, obesity, hypertension, hyperlipidemia, GERD, anxiety and depression.  In October of this year, the patient reached out via MyChart to Dr. Kirke Corin reporting periods of short of breath, dizziness, nausea and near syncope.  He actually put himself on a twelve-lead EKG during the symptoms and noted bigeminy with a perfusing heart rate of 38 corresponding to bigeminal PVCs.  A ZIO monitor was ordered which demonstrated a PVC burden of 32%.  The PVCs were monomorphic.    Past Medical History:  Diagnosis Date  . Anxiety   . Depression   . Family history of premature CAD    a. in father with CAD s/p CABG in early 76s  . GERD (gastroesophageal reflux disease)   . Hyperlipidemia   . Hypertension   . Lumbar spinal stenosis    herniated discs  . Morbid obesity ([facility])   . Pituitary adenoma ([facility])   . Sleep apnea    uses cpap some nights set oon 7   . Spinal stenosis   . Tobacco abuse    a. dipping    Past Surgical History:  Procedure Laterality Date  . colonscopy  2-3 yrs ago  . EYE SURGERY Left    secondary to trauma as a child  . LUMBAR LAMINECTOMY/DECOMPRESSION MICRODISCECTOMY N/A 01/18/2017   Procedure: Microlumbar decompression L2-3;  Surgeon: Jene Every, MD;  Location: [facility];  Service: Orthopedics;  Laterality: N/A;  120 Mins    Current Medications: Current Meds  Medication Sig  . cetirizine (ZYRTEC) 10 MG tablet Take 10 mg by mouth daily.  .  [DISCONTINUED] diltiazem (CARDIZEM CD) 120 MG 24 hr capsule Take 1 capsule (120 mg total) by mouth daily.     Allergies:   Patient has no known allergies.   Social History   Socioeconomic History  . Marital status: Single    Spouse name: Not on file  . Number of children: Not on file  . Years of education: Not on file  . Highest education level: Not on file  Occupational History  . Not on file  Tobacco Use  . Smoking status: Never Smoker  . Smokeless tobacco: Current User    Types: Chew  . Tobacco comment: 07/08/2020 "I have never smoked."  Vaping Use  . Vaping Use: Never used  Substance and Sexual Activity  . Alcohol use: Not Currently  . Drug use: No  . Sexual activity: Not on file  Other Topics Concern  . Not on file  Social History Narrative  . Not on file   Social Determinants of Health   Financial Resource Strain: Not on file  Food Insecurity: Not on file  Transportation Needs: Not on file  Physical Activity: Not on file  Stress: Not on file  Social Connections: Not on file     Family History: The patient's family history includes Cancer in his maternal aunt and maternal grandmother; Cancer (age of onset: 72) in his maternal uncle;  Heart attack (age of onset: 51) in his father; Heart disease in his father; Hyperlipidemia in his father; Hypertension in his father and mother; Mental illness in his father.  ROS:   Please see the history of present illness.    All other systems reviewed and are negative.  EKGs/Labs/Other Studies Reviewed:    The following studies were reviewed today: Prior EKGs  August 04, 2019 EKG demonstrated several PVCs as below.  These appear to be originating from the outflow tracts.  Difficult to say left versus right but I favor it being a left ventricular outflow tract given the V2 transition ratio.   EKGs on February 25, April 27 of 2021 do not show PVCs  May 25, 2020 ZIO personally reviewed 32% PVC burden,  monomorphic   EKG:  The ekg ordered today demonstrates sinus rhythm with trigeminal monomorphic PVCs.  On today's EKG the precordial transition is nearly simultaneous with the sinus precordial transition.  September 02, 2019 echo personally reviewed Left ventricular function normal, 55% Right ventricular function normal    Recent Labs: 08/04/2019: ALT 34 04/15/2020: BUN 11; Creatinine, Ser 0.90; Hemoglobin 14.9; Magnesium 2.3; Platelets 222; Potassium 3.7; Sodium 140; TSH 2.378  Recent Lipid Panel    Component Value Date/Time   CHOL 164 12/23/2016 1050   TRIG 93 12/23/2016 1050   HDL 33 (L) 12/23/2016 1050   CHOLHDL 5.0 12/23/2016 1050   CHOLHDL 6.0 03/04/2015 1843   VLDL 35 03/04/2015 1843   LDLCALC 112 (H) 12/23/2016 1050    Physical Exam:    VS:  BP 130/90 (BP Location: Left Arm, Patient Position: Sitting, Cuff Size: Large)   Pulse 78   Ht 5\' 10"  (1.778 m)   Wt 290 lb (131.5 kg)   SpO2 97%   BMI 41.61 kg/m     Wt Readings from Last 3 Encounters:  07/08/20 290 lb (131.5 kg)  10/29/19 288 lb (130.6 kg)  08/29/19 289 lb (131.1 kg)     GEN:  Well nourished, well developed in no acute distress HEENT: Normal NECK: No JVD; No carotid bruits LYMPHATICS: No lymphadenopathy CARDIAC: Irregular rhythm, no murmurs, rubs, gallops RESPIRATORY:  Clear to auscultation without rales, wheezing or rhonchi  ABDOMEN: Soft, non-tender, non-distended MUSCULOSKELETAL:  No edema; No deformity  SKIN: Warm and dry NEUROLOGIC:  Alert and oriented x 3 PSYCHIATRIC:  Normal affect   ASSESSMENT:    1. Frequent PVCs   2. Non-sustained ventricular tachycardia ([place])   3. SOB (shortness of breath)    PLAN:    In order of problems listed above:  1. 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 Patient with symptomatic PVCs that are currently being managed with Cardizem.  I discussed the treatment options for his outflow tract PVCs including  continued medical management with calcium channel blockers or beta-blockers versus ablation.  We discussed the ablation procedure in detail during today's visit including the possibility that it could be originating from the left ventricular outflow tract and thus would require venous and arterial access.  We discussed the risks and expected recovery time from the ablation procedure.  I think he is a good candidate for ablation given the outflow tract origin of the PVC and his young age.  He is at a slight increased risk of bleeding from the groin access sites due to his BMI.  After discussing the options with the patient and his wife who is with him today, he would like to first use medical  therapy in an attempt to suppress the PVC.  We will increase his dose of Cardizem to 120 mg by mouth twice daily.  I will order an echocardiogram to confirm normal left ventricular function.  If the left ventricular function is decreased would favor an aggressive approach to PVC suppression.  I will plan on seeing him back in 3 months to assess response to therapy and to review his echocardiogram results.  2.  Shortness of breath Based on his history and EKG, this is likely due to nonperfused PVCs.  Medication Adjustments/Labs and Tests Ordered: Current medicines are reviewed at length with the patient today.  Concerns regarding medicines are outlined above.  Orders Placed This Encounter  Procedures  . EKG 12-Lead  . ECHOCARDIOGRAM COMPLETE   Meds ordered this encounter  Medications  . diltiazem (CARDIZEM CD) 120 MG 24 hr capsule    Sig: Take 1 capsule (120 mg) by mouth twice daily    Dispense:  180 capsule    Refill:  1    Dose increase     Signed, Lars Mage, MD, [facility]  07/08/2020 3:44 PM    Electrophysiology [place]

## 2020-07-10 ENCOUNTER — Other Ambulatory Visit: Payer: Self-pay

## 2020-07-10 ENCOUNTER — Ambulatory Visit (INDEPENDENT_AMBULATORY_CARE_PROVIDER_SITE_OTHER): Payer: Self-pay

## 2020-07-10 DIAGNOSIS — I493 Ventricular premature depolarization: Secondary | ICD-10-CM

## 2020-07-10 LAB — ECHOCARDIOGRAM COMPLETE
AR max vel: 3.55 cm2
AV Area VTI: 3.28 cm2
AV Area mean vel: 3.45 cm2
AV Mean grad: 3 mmHg
AV Peak grad: 5.4 mmHg
Ao pk vel: 1.16 m/s
Area-P 1/2: 4.31 cm2

## 2020-07-10 MED ORDER — PERFLUTREN LIPID MICROSPHERE
1.0000 mL | INTRAVENOUS | Status: AC | PRN
  Administered 2020-07-10: 2 mL via INTRAVENOUS

## 2020-09-03 ENCOUNTER — Other Ambulatory Visit: Payer: Self-pay | Admitting: Cardiovascular Disease

## 2020-09-03 DIAGNOSIS — I493 Ventricular premature depolarization: Secondary | ICD-10-CM

## 2020-10-07 ENCOUNTER — Ambulatory Visit: Payer: Self-pay | Admitting: Cardiology

## 2020-10-08 ENCOUNTER — Encounter: Payer: Self-pay | Admitting: Cardiology

## 2021-03-02 ENCOUNTER — Other Ambulatory Visit: Payer: Self-pay | Admitting: Cardiology

## 2021-03-02 DIAGNOSIS — I493 Ventricular premature depolarization: Secondary | ICD-10-CM

## 2021-03-03 NOTE — Telephone Encounter (Signed)
Please contact pt for future appointment. Pt overdue for 3 month f/u. Pt needing refills.

## 2021-04-06 NOTE — Progress Notes (Signed)
Electrophysiology Office Follow up Visit Note:    Date:  04/07/2021   ID:  Adele Barthel, DOB 03/18/1986, MRN [id number]  PCP:  Patient, No Pcp Per (Inactive)  [facility] Cardiologist:  None  [facility] Electrophysiologist:  Vickie Epley, MD    Interval History:    Seth Parker is a 35 y.o. male who presents for a follow up visit for PVCs. I last saw him 07/08/2020. His PVCs are symptomatic with inadequately perfused PVCs. PVC burden in the past was as high as 32%. He was previously maintained on cardizem. Follow up was planned for April.  Since I last saw him he has been doing well.  He thinks that his symptoms have improved since taking the diltiazem.  No syncope or presyncope.     Past Medical History:  Diagnosis Date   Anxiety    Depression    Family history of premature CAD    a. in father with CAD s/p CABG in early 44s   GERD (gastroesophageal reflux disease)    Hyperlipidemia    Hypertension    Lumbar spinal stenosis    herniated discs   Morbid obesity ([place])    Pituitary adenoma ([place])    Sleep apnea    uses cpap some nights set oon 7    Spinal stenosis    Tobacco abuse    a. dipping    Past Surgical History:  Procedure Laterality Date   colonscopy  2-3 yrs ago   EYE SURGERY Left    secondary to trauma as a child   LUMBAR LAMINECTOMY/DECOMPRESSION MICRODISCECTOMY N/A 01/18/2017   Procedure: Microlumbar decompression L2-3;  Surgeon: Susa Day, MD;  Location: [facility];  Service: Orthopedics;  Laterality: N/A;  120 Mins    Current Medications: Current Meds  Medication Sig   cetirizine (ZYRTEC) 10 MG tablet Take 10 mg by mouth daily.   diltiazem (CARDIZEM CD) 120 MG 24 hr capsule Take 1 capsule (120 mg) by mouth twice daily. PLEASE KEEP SCHEDULED APPOINTMENT IN OCTOBER     Allergies:   Patient has no known allergies.   Social History   Socioeconomic History   Marital status: Single    Spouse name: Not on file   Number of children: Not on  file   Years of education: Not on file   Highest education level: Not on file  Occupational History   Not on file  Tobacco Use   Smoking status: Never   Smokeless tobacco: Current    Types: Chew   Tobacco comments:    07/08/2020 "I have never smoked."  Vaping Use   Vaping Use: Never used  Substance and Sexual Activity   Alcohol use: Not Currently   Drug use: No   Sexual activity: Not on file  Other Topics Concern   Not on file  Social History Narrative   Not on file   Social Determinants of Health   Financial Resource Strain: Not on file  Food Insecurity: Not on file  Transportation Needs: Not on file  Physical Activity: Not on file  Stress: Not on file  Social Connections: Not on file     Family History: The patient's family history includes Cancer in his maternal aunt and maternal grandmother; Cancer (age of onset: 36) in his maternal uncle; Heart attack (age of onset: 15) in his father; Heart disease in his father; Hyperlipidemia in his father; Hypertension in his father and mother; Mental illness in his father.  ROS:   Please see  the history of present illness.    All other systems reviewed and are negative.  EKGs/Labs/Other Studies Reviewed:    The following studies were reviewed today:   07/10/2020 echo EF 60% RV normal   EKG:  The ekg ordered today demonstrates sinus rhythm with frequent monomorphic PVCs.  PVCs have a steeply inferior axis.  There is a precordial transition in V3 and appears to be immediately after the sinus transition.  Recent Labs: 04/15/2020: BUN 11; Creatinine, Ser 0.90; Hemoglobin 14.9; Magnesium 2.3; Platelets 222; Potassium 3.7; Sodium 140; TSH 2.378  Recent Lipid Panel    Component Value Date/Time   CHOL 164 12/23/2016 1050   TRIG 93 12/23/2016 1050   HDL 33 (L) 12/23/2016 1050   CHOLHDL 5.0 12/23/2016 1050   CHOLHDL 6.0 03/04/2015 1843   VLDL 35 03/04/2015 1843   LDLCALC 112 (H) 12/23/2016 1050    Physical Exam:    VS:   BP 132/76   Pulse 83   Ht 5\' 10"  (1.778 m)   Wt (!) 304 lb 3.2 oz (138 kg)   SpO2 98%   BMI 43.65 kg/m     Wt Readings from Last 3 Encounters:  04/07/21 (!) 304 lb 3.2 oz (138 kg)  07/08/20 290 lb (131.5 kg)  10/29/19 288 lb (130.6 kg)     GEN:  Well nourished, well developed in no acute distress.  Obese HEENT: Normal NECK: No JVD; No carotid bruits LYMPHATICS: No lymphadenopathy CARDIAC: RRR, no murmurs, rubs, gallops RESPIRATORY:  Clear to auscultation without rales, wheezing or rhonchi  ABDOMEN: Soft, non-tender, non-distended MUSCULOSKELETAL:  No edema; No deformity  SKIN: Warm and dry NEUROLOGIC:  Alert and oriented x 3 PSYCHIATRIC:  Normal affect   ASSESSMENT:    1. Frequent PVCs   2. Non-sustained ventricular tachycardia   3. Essential hypertension    PLAN:    In order of problems listed above:    #Frequent PVCs and NSVT Appear to be outflow tract in origin.  Improved on diltiazem but he still has a fairly high burden of PVCs.  Thankfully, he is relatively asymptomatic.  His left ventricular function was normal in January.  He would like to continue with conservative management for now.  I did discuss the possibility of adding flecainide 100 mg by mouth twice daily to his medical regimen.  We discussed how this would require an ETT 7 to 10 days after starting the flecainide.  We also discussed EP study and ablation during today's visit.  He is working on Patent examiner coverage/Medicaid.  He will let us know if his symptoms worsen.  If they do, would favor starting flecainide.  #Hypertension Controlled.  Continue diltiazem.    Follow-up 6 months with echo prior to visit.       Medication Adjustments/Labs and Tests Ordered: Current medicines are reviewed at length with the patient today.  Concerns regarding medicines are outlined above.  No orders of the defined types were placed in this encounter.  No orders of the defined types were  placed in this encounter.    Signed, Lars Mage, MD, [facility], [facility] 04/07/2021 2:08 PM    Electrophysiology [facility]

## 2021-04-07 ENCOUNTER — Encounter: Payer: Self-pay | Admitting: Cardiology

## 2021-04-07 ENCOUNTER — Ambulatory Visit (INDEPENDENT_AMBULATORY_CARE_PROVIDER_SITE_OTHER): Payer: Self-pay | Admitting: Cardiology

## 2021-04-07 ENCOUNTER — Telehealth: Payer: Self-pay | Admitting: Cardiology

## 2021-04-07 ENCOUNTER — Other Ambulatory Visit: Payer: Self-pay

## 2021-04-07 VITALS — BP 132/76 | HR 83 | Ht 70.0 in | Wt 304.2 lb

## 2021-04-07 DIAGNOSIS — I4729 Other ventricular tachycardia: Secondary | ICD-10-CM

## 2021-04-07 DIAGNOSIS — I493 Ventricular premature depolarization: Secondary | ICD-10-CM

## 2021-04-07 DIAGNOSIS — I10 Essential (primary) hypertension: Secondary | ICD-10-CM

## 2021-04-07 NOTE — Telephone Encounter (Signed)
Patient needs 6 m fu after echo .  Needs afternoon due to work.  Will fu when April schedule is available.  Patient agreeable to call back .

## 2021-04-07 NOTE — Patient Instructions (Addendum)
Medication Instructions:  Your physician recommends that you continue on your current medications as directed. Please refer to the Current Medication list given to you today. *If you need a refill on your cardiac medications before your next appointment, please call your pharmacy*  Lab Work: None ordered. If you have labs (blood work) drawn today and your tests are completely normal, you will receive your results only by: [place] (if you have MyChart) OR A paper copy in the mail If you have any lab test that is abnormal or we need to change your treatment, we will call you to review the results.  Testing/Procedures: Your physician has requested that you have an echocardiogram. Echocardiography is a painless test that uses sound waves to create images of your heart. It provides your doctor with information about the size and shape of your heart and how well your heart's chambers and valves are working. This procedure takes approximately one hour. There are no restrictions for this procedure.  Please schedule for ECHO in 6 months with appointment after ECHO  Follow-Up: At [facility], you and your health needs are our priority.  As part of our continuing mission to provide you with exceptional heart care, we have created designated Provider Care Teams.  These Care Teams include your primary Cardiologist (physician) and Advanced Practice Providers (APPs -  Physician Assistants and Nurse Practitioners) who all work together to provide you with the care you need, when you need it.  Your next appointment:   Your physician wants you to follow-up in: 6 months with Dr. Quentin Ore

## 2021-04-16 ENCOUNTER — Other Ambulatory Visit: Payer: Self-pay | Admitting: Cardiology

## 2021-04-16 DIAGNOSIS — I493 Ventricular premature depolarization: Secondary | ICD-10-CM

## 2021-04-16 NOTE — Telephone Encounter (Signed)
This is a [place] pt 

## 2021-08-02 ENCOUNTER — Emergency Department: Payer: Self-pay

## 2021-08-02 ENCOUNTER — Emergency Department
Admission: EM | Admit: 2021-08-02 | Discharge: 2021-08-02 | Disposition: A | Discharge status: Home / Self Care | Payer: Self-pay | Attending: Emergency Medicine | Admitting: Emergency Medicine

## 2021-08-02 ENCOUNTER — Encounter: Payer: Self-pay | Admitting: Emergency Medicine

## 2021-08-02 ENCOUNTER — Other Ambulatory Visit: Payer: Self-pay

## 2021-08-02 DIAGNOSIS — R079 Chest pain, unspecified: Secondary | ICD-10-CM | POA: Insufficient documentation

## 2021-08-02 DIAGNOSIS — R110 Nausea: Secondary | ICD-10-CM | POA: Insufficient documentation

## 2021-08-02 DIAGNOSIS — R42 Dizziness and giddiness: Secondary | ICD-10-CM | POA: Insufficient documentation

## 2021-08-02 DIAGNOSIS — I493 Ventricular premature depolarization: Secondary | ICD-10-CM | POA: Insufficient documentation

## 2021-08-02 LAB — CBC
HCT: 47.5 % (ref 39.0–52.0)
Hemoglobin: 15.8 g/dL (ref 13.0–17.0)
MCH: 29.6 pg (ref 26.0–34.0)
MCHC: 33.3 g/dL (ref 30.0–36.0)
MCV: 89.1 fL (ref 80.0–100.0)
Platelets: 251 10*3/uL (ref 150–400)
RBC: 5.33 MIL/uL (ref 4.22–5.81)
RDW: 11.9 % (ref 11.5–15.5)
WBC: 7.4 10*3/uL (ref 4.0–10.5)
nRBC: 0 % (ref 0.0–0.2)

## 2021-08-02 LAB — BASIC METABOLIC PANEL
Anion gap: 9 (ref 5–15)
BUN: 10 mg/dL (ref 6–20)
CO2: 25 mmol/L (ref 22–32)
Calcium: 8.9 mg/dL (ref 8.9–10.3)
Chloride: 102 mmol/L (ref 98–111)
Creatinine, Ser: 0.87 mg/dL (ref 0.61–1.24)
GFR, Estimated: >60 mL/min (ref >60)
Glucose, Bld: 104 mg/dL — ABNORMAL HIGH (ref 70–99)
Potassium: 3.6 mmol/L (ref 3.5–5.1)
Sodium: 136 mmol/L (ref 135–145)

## 2021-08-02 LAB — TROPONIN I (HIGH SENSITIVITY)
Troponin I (High Sensitivity): 6 ng/L (ref <18)
Troponin I (High Sensitivity): 5 ng/L (ref <18)

## 2021-08-02 LAB — TSH: TSH: 3.06 u[IU]/mL (ref 0.350–4.500)

## 2021-08-02 LAB — MAGNESIUM: Magnesium: 2.3 mg/dL (ref 1.7–2.4)

## 2021-08-02 MED ORDER — POTASSIUM CHLORIDE CRYS ER 20 MEQ PO TBCR
40.0000 meq | EXTENDED_RELEASE_TABLET | Freq: Once | ORAL | Status: AC
  Administered 2021-08-02: 40 meq via ORAL
  Filled 2021-08-02: qty 2

## 2021-08-02 MED ORDER — DILTIAZEM HCL ER COATED BEADS 240 MG PO CP24
240.0000 mg | ORAL_CAPSULE | Freq: Every day | ORAL | Status: DC
  Administered 2021-08-02: 240 mg via ORAL
  Filled 2021-08-02 (×2): qty 1

## 2021-08-02 MED ORDER — DILTIAZEM HCL ER COATED BEADS 120 MG PO CP24: 240.0000 mg | ORAL_CAPSULE | Freq: Every day | ORAL | 0 refills | Status: DC

## 2021-08-02 MED ORDER — DILTIAZEM HCL ER COATED BEADS 120 MG PO CP24
120.0000 mg | ORAL_CAPSULE | Freq: Every day | ORAL | Status: DC
  Filled 2021-08-02 (×2): qty 1

## 2021-08-02 NOTE — ED Triage Notes (Signed)
Pt via POV from home. Pt c/o dizziness, nausea, and near syncope for the past 2 days. Pt states that he has hx of bigeminy and runs of V-tach. Pt is on diltiazem  but has not missed any doses. Pt is A&Ox4 and NAD.

## 2021-08-02 NOTE — ED Provider Triage Note (Signed)
Emergency Medicine Provider Triage Evaluation Note  Seth Parker , a 36 y.o. male  was evaluated in triage.  Pt complains of dizziness, weakness, palpitations, and headache with history of palpitations. Currently on Cardizem. Symptoms worse over the past few days. No chest pain currently.  Review of Systems  Positive: Palpitations, headache. Negative: Nausea, vomiting, diarrhea  Physical Exam  There were no vitals taken for this visit. Gen:   Awake, no distress   Resp:  Normal effort  MSK:   Moves extremities without difficulty  Other:    Medical Decision Making  Medically screening exam initiated at 4:00 PM.  Appropriate orders placed.  Adele Barthel was informed that the remainder of the evaluation will be completed by another provider, this initial triage assessment does not replace that evaluation, and the importance of remaining in the ED until their evaluation is complete.   Victorino Dike, FNP 08/02/21 [phone]

## 2021-08-02 NOTE — ED Provider Notes (Signed)
[facility] Provider Note    Event Date/Time   First MD Initiated Contact with Patient 08/02/21 2057     (approximate)   History   Dizziness   HPI  Seth Parker is a 36 y.o. male with a past medical history of obesity, tobacco dipping, HTN, OSA on CPAP and symptomatic PVCs currently prescribed diltiazem last seen by EP on 04/07/2021 who presents for assessment of 3 to 4 days of intermittent sharp chest pains associate with nausea and dizziness and headache.  Patient denies any injuries or falls changes to his medications or missed doses of his Cardizem.  He denies EtOH use or illicit drug use.  States his dizziness with associated nausea but has not had any significant vomiting.  He states chest pain headaches seem to come and go randomly and not clearly associated with exertion or movement.  He denies any abdominal pain, diarrhea, urinary symptoms, back pain rash or any acute extremity pain weakness or numbness.  No prior similar episodes.  No other acute concerns or symptoms as far as he can recall.      Physical Exam  Triage Vital Signs: ED Triage Vitals  Enc Vitals Group     BP 08/02/21 1603 (!) 148/103     Pulse Rate 08/02/21 1603 68     Resp 08/02/21 1603 18     Temp 08/02/21 1603 98.1 F (36.7 C)     Temp Source 08/02/21 1603 Oral     SpO2 08/02/21 1603 97 %     Weight 08/02/21 1604 300 lb (136.1 kg)     Height 08/02/21 1604 5\' 10"  (1.778 m)     Head Circumference --      Peak Flow --      Pain Score 08/02/21 1604 4     Pain Loc --      Pain Edu? --      Excl. in [place]? --     Most recent vital signs: Vitals:   08/02/21 2037 08/02/21 2113  BP: (!) 178/92 (!) 144/73  Pulse: 66 79  Resp: 16 20  Temp:    SpO2: 97% 100%    General: Awake, no distress.  CV:  Good peripheral perfusion.  2+ radial pulses. Resp:  Normal effort.  Clear bilaterally. Abd:  No distention.  Soft throughout. Other:  Cranial nerves II through XII grossly intact.   No pronator drift.  No finger dysmetria.  Symmetric 5/5 strength of all extremities.  Sensation intact to light touch in all extremities.   Oropharynx and ears are unremarkable bilaterally.    ED Results / Procedures / Treatments  Labs (all labs ordered are listed, but only abnormal results are displayed) Labs Reviewed  BASIC METABOLIC PANEL - Abnormal; Notable for the following components:      Result Value   Glucose, Bld 104 (*)    All other components within normal limits  CBC  MAGNESIUM  TSH  TROPONIN I (HIGH SENSITIVITY)  TROPONIN I (HIGH SENSITIVITY)     EKG  ECG is remarkable for sinus rhythm with a ventricular rate of 72, PVCs and nonspecific ST change in lead III and aVF without other clear evidence of acute ischemia or significant arrhythmia.   RADIOLOGY Chest reviewed by myself shows no focal consoidation, effusion, edema, pneumothorax or other clear acute thoracic process. I also reviewed radiology interpretation and agree with findings described.  CT ordered and interpreted by myself shows no evidence of ischemia, mass-effect, edema, ventriculomegaly or  any other acute intracranial process.  Also reviewed radiologist interpretation and agree with their findings.    PROCEDURES:  Critical Care performed: No  .1-3 Lead EKG Interpretation Performed by: Lucrezia Starch, MD Authorized by: Lucrezia Starch, MD     Interpretation: non-specific     ECG rate assessment: normal     Rhythm: sinus rhythm     Ectopy: PVCs    The patient is on the cardiac monitor to evaluate for evidence of arrhythmia and/or significant heart rate changes.   MEDICATIONS ORDERED IN ED: Medications  potassium chloride SA (KLOR-CON M) CR tablet 40 mEq (has no administration in time range)  diltiazem (CARDIZEM CD) 24 hr capsule 240 mg (has no administration in time range)     IMPRESSION / MDM / ASSESSMENT AND PLAN / ED COURSE  I reviewed the triage vital signs and the nursing  notes.                              Differential diagnosis includes, but is not limited to symptomatic PVCs versus other arrhythmia, ACS, anemia, [place] derangements and thyroid abnormalities.  There is no history or exam features to suggest CVA, preceding trauma, alcohol withdrawal, toxic ingestion patient is currently headache and chest pain-free with normal vitals and I have low suspicion for dissection or PE.  ECG is remarkable for sinus rhythm with a ventricular rate of 72, PVCs and nonspecific ST change in lead III and aVF without other clear evidence of acute ischemia or significant arrhythmia.  Troponins x2 are nonelevated and overall not suggestive of ACS or myocarditis.  CBC shows no leukocytosis or acute anemia.  BMP shows no significant electrolyte or metabolic derangements.  Magnesium is within normal limits.  Chest reviewed by myself shows no focal consoidation, effusion, edema, pneumothorax or other clear acute thoracic process. I also reviewed radiology interpretation and agree with findings described.  CT head ordered and interpreted by myself shows no evidence of ischemia, mass-effect, edema, ventriculomegaly or any other acute intracranial process.  Also reviewed radiologist interpretation and agree with their findings.  I suspect constellation of symptoms is related to symptomatic PVCs.  I discussed this with on-call cardiologist Dr. Davina Poke who recommended increasing patient's diltiazem to 240 mg twice daily and close outpatient EP follow-up.  Patient given a dose of this as well as a dose of potassium in the emergency room to get his potassium closer to 4.  Discussed plan with patient he is amenable to this.  Discharged in stable condition.  Strict return cautions advised and discussed.  I did consider admission for observation and further evaluation of at this point given stable vitals with reassuring exam and work-up I think he is stable for discharge continue outpatient  evaluation with cardiology.       FINAL CLINICAL IMPRESSION(S) / ED DIAGNOSES   Final diagnoses:  Dizziness  Symptomatic PVCs     Rx / DC Orders   ED Discharge Orders          Ordered    diltiazem (CARDIZEM CD) 120 MG 24 hr capsule  Daily        08/02/21 2247             Note:  This document was prepared using Dragon voice recognition software and may include unintentional dictation errors.   Lucrezia Starch, MD 08/02/21 2251

## 2021-08-10 ENCOUNTER — Ambulatory Visit (INDEPENDENT_AMBULATORY_CARE_PROVIDER_SITE_OTHER): Payer: Self-pay | Admitting: Nurse Practitioner

## 2021-08-10 ENCOUNTER — Other Ambulatory Visit: Payer: Self-pay

## 2021-08-10 ENCOUNTER — Encounter: Payer: Self-pay | Admitting: Nurse Practitioner

## 2021-08-10 VITALS — BP 120/84 | HR 72 | Ht 70.0 in | Wt 301.0 lb

## 2021-08-10 DIAGNOSIS — E876 Hypokalemia: Secondary | ICD-10-CM

## 2021-08-10 DIAGNOSIS — I4729 Other ventricular tachycardia: Secondary | ICD-10-CM

## 2021-08-10 DIAGNOSIS — I493 Ventricular premature depolarization: Secondary | ICD-10-CM

## 2021-08-10 DIAGNOSIS — R0789 Other chest pain: Secondary | ICD-10-CM

## 2021-08-10 DIAGNOSIS — I10 Essential (primary) hypertension: Secondary | ICD-10-CM

## 2021-08-10 NOTE — Progress Notes (Signed)
Office Visit    Patient Name: STEPHANIE MCGLONE Date of Encounter: 08/10/2021  Primary Care Provider:  Patient, No Pcp Per (Inactive) Primary Cardiologist:  Kathlyn Sacramento, MD/ EP: Grayce Sessions, MD  Chief Complaint    36 year old male with a history of symptomatic PVCs, obesity, hypertension, hyperlipidemia, sleep apnea, and chest pain with normal coronary arteries by coronary CTA (March 2021), who presents for follow-up for PVCs.  Past Medical History    Past Medical History:  Diagnosis Date   Anxiety    Chest pain    a. 08/2014 ETT: Ex time 6:40. Max HR 169. no ST/T changes; b. 09/2019 Cor CTA: LM nl, LAD nl, LCX nl, RCA nl. Cor Ca2+ = 0.   Depression    Family history of premature CAD    a. in father with CAD s/p CABG in early 23s   GERD (gastroesophageal reflux disease)    History of echocardiogram    a. 09/2019 Echo: EF 55-60%, no rwma, mild LVH; b. 07/2020 Echo: EF 60-65%, no rwma, nl RV fxn.   Hyperlipidemia    Hypertension    Lumbar spinal stenosis    herniated discs   Morbid obesity ([facility])    Pituitary adenoma ([facility])    PVC's (premature ventricular contractions)    a. 05/2020 Zio: RSR, 79, 17 short runs of NSVT (longest 5 beats), 32% PVC burden.   Sleep apnea    uses cpap some nights set oon 7    Spinal stenosis    Tobacco abuse    a. dipping   Past Surgical History:  Procedure Laterality Date   colonscopy  2-3 yrs ago   EYE SURGERY Left    secondary to trauma as a child   LUMBAR LAMINECTOMY/DECOMPRESSION MICRODISCECTOMY N/A 01/18/2017   Procedure: Microlumbar decompression L2-3;  Surgeon: Susa Day, MD;  Location: [facility];  Service: Orthopedics;  Laterality: N/A;  120 Mins    Allergies  No Known Allergies  History of Present Illness    36 year old male with above past medical history including symptomatic PVCs, obesity, hypertension, hyperlipidemia, sleep apnea, and chest pain.  He also has a family history of premature CAD involving his father (at age 70).   In the setting of atypical chest pain, he underwent exercise treadmill testing in February 2016, which was normal.  Following COVID-19 infection in January 2021, he was evaluated for chest pain in the emergency department and was subsequently noted to have frequent PVCs.  Echocardiogram in March 2021 showed normal LV function.  Coronary CT angiogram was carried out and showed normal coronary arteries with a calcium score of 0.  He subsequently underwent event monitoring in November 2022 which showed a 32% PVC burden.  He was placed on diltiazem and subsequently seen by electrophysiology with titration of diltiazem to 120 mg twice daily in January 2022.  Echocardiogram at that time showed an EF of 6065% with normal R RV function.  Patient was doing well when he was seen at cardiology visit April 07, 2021.  Unfortunately, in late January, patient had a 2 to 3-day episode of intermittent chest pain, nausea, and dizziness.  He noted more frequent PVCs on his Apple Watch as well and presented to the emergency department on January 30.  There, he was hypertensive.  Lab work notable for borderline hypokalemia with potassium 3.6 and he was supplemented.  His troponins were normal as was TSH and blood counts.  A head CT was performed and was normal.  Chest x-ray  showed no acute process.  After discussion with on-call cardiology, he was advised to increase his diltiazem to 240 mg twice daily and follow-up today.  He has not any recurrent symptoms.  He continues to check his Apple Watch/heart rhythm frequently and notes occasional PVCs though overall, he has not been particularly symptomatic.  He denies dyspnea, PND, orthopnea, edema, or early satiety.  Home Medications    Current Outpatient Medications  Medication Sig Dispense Refill   cetirizine (ZYRTEC) 10 MG tablet Take 10 mg by mouth daily.     diltiazem (CARDIZEM CD) 120 MG 24 hr capsule Take 120 mg by mouth 2 (two) times daily.     No current  facility-administered medications for this visit.     Review of Systems    He was experiencing intermittent chest pain with dizziness and nausea leading up to January 30 ED visit.  Occasional palpitations currently but no further chest pain, PND, orthopnea, dyspnea, dizziness, syncope, edema, or early satiety.  All other systems reviewed and are otherwise negative except as noted above.    Physical Exam    VS:  BP 120/84 (BP Location: Left Arm, Patient Position: Sitting, Cuff Size: Large)    Pulse 72    Ht 5\' 10"  (1.778 m)    Wt (!) 301 lb (136.5 kg)    SpO2 98%    BMI 43.19 kg/m  , BMI Body mass index is 43.19 kg/m.     GEN: Obese, in no acute distress. HEENT: normal. Neck: Supple, obese, difficult to gauge JVP.  No carotid bruits or masses. Cardiac: RRR, no murmurs, rubs, or gallops. No clubbing, cyanosis, edema.  Radials/PT 2+ and equal bilaterally.  Respiratory:  Respirations regular and unlabored, clear to auscultation bilaterally. GI: Obese, soft, nontender, nondistended, BS + x 4. MS: no deformity or atrophy. Skin: warm and dry, no rash. Neuro:  Strength and sensation are intact. Psych: Normal affect.  Accessory Clinical Findings    ECG personally reviewed by me today -sinus rhythm, 72, prominent U waves- no acute changes.  Lab Results  Component Value Date   WBC 7.4 08/02/2021   HGB 15.8 08/02/2021   HCT 47.5 08/02/2021   MCV 89.1 08/02/2021   PLT 251 08/02/2021   Lab Results  Component Value Date   CREATININE 0.87 08/02/2021   BUN 10 08/02/2021   NA 136 08/02/2021   K 3.6 08/02/2021   CL 102 08/02/2021   CO2 25 08/02/2021   Lab Results  Component Value Date   ALT 34 08/04/2019   AST 32 08/04/2019   GGT 28 12/23/2016   ALKPHOS 50 08/04/2019   BILITOT 1.1 08/04/2019   Lab Results  Component Value Date   CHOL 164 12/23/2016   HDL 33 (L) 12/23/2016   LDLCALC 112 (H) 12/23/2016   TRIG 93 12/23/2016   CHOLHDL 5.0 12/23/2016    Lab Results  Component  Value Date   HGBA1C 5.1 08/30/2016    Assessment & Plan    1.  Symptomatic PVCs/NSVT: Patient with a history of symptomatic PVCs dating back to early 2021 with 32% PVC burden on monitoring in November 2021.  Prior coronary CT angiogram in March 2021 showed normal coronary arteries with a calcium score of 0.  Most recent echo in January 2022 showed an EF of 60 to 65%, and he is scheduled for repeat echo next month.  Recently seen in the emergency department with a several day history of fleeting chest pain, nausea, dizziness, and 2 episodes  of vomiting.  He was noted to be mildly hypokalemic at 3.6 and this was supplemented.  Lab work, chest x-ray, and CT of the head were otherwise unremarkable.  His diltiazem was increased from 120 mg twice daily to 240 mg twice daily, and symptoms have improved.  He questions today if his PVCs have improved secondary to supplementation of potassium in the ED, titration of diltiazem, or resolution of possible GI illness leading to symptoms.  He has prominent U waves on his EKG and I will follow-up with basic metabolic panel today to reassess his potassium.  We agreed that he may try reducing his diltiazem back to 120 mg twice daily since he had success with that dose for, but he should have a very low threshold to contact us or increase back to 240 mg twice daily for recurrent palpitations.  We also discussed the potential role of an antiarrhythmic such as flecainide at this time, he prefers to hold off.  He has follow-up with Dr. Quentin Ore next month.  2.  Essential hypertension: Blood pressure stable today.  3.  Obstructive sleep apnea: Uses CPAP.  4.  Atypical chest pain: In the setting of above, patient with brief and fleeting dull aching discomfort in his chest over several day period.  ECG without acute ST or T changes and troponins normal in the emergency department.  Previous evaluation with coronary CT angiogram in 2021 showing calcium score of 0 and normal  coronary arteries.  Suspect symptoms may have been related to PVCs or potential GI illness.  No further symptoms.  No further ischemic evaluation at this time.  5.  Borderline hypokalemia: Potassium 3.6 in ED.  Follow-up today given prominent U waves on twelve-lead.  6.  Disposition: Follow-up basic metabolic panel today.  He has a follow-up echocardiogram and EP office visit arranged for March.  Murray Hodgkins, NP 08/10/2021, 5:30 PM

## 2021-08-10 NOTE — Patient Instructions (Addendum)
Medication Instructions:  Your physician recommends that you continue on your current medications as directed. Please refer to the Current Medication list given to you today.  *If you need a refill on your cardiac medications before your next appointment, please call your pharmacy*   Lab Work: BMET today If you have labs (blood work) drawn today and your tests are completely normal, you will receive your results only by: [place] [facility] (if you have MyChart) OR A paper copy in the mail If you have any lab test that is abnormal or we need to change your treatment, we will call you to review the results.  Follow-Up: At [facility], you and your health needs are our priority.  As part of our continuing mission to provide you with exceptional heart care, we have created designated Provider Care Teams.  These Care Teams include your primary Cardiologist (physician) and Advanced Practice Providers (APPs -  Physician Assistants and Nurse Practitioners) who all work together to provide you with the care you need, when you need it.   Your next appointment:   09/22/21 ECHO at 3:00 PM as scheduled 09/29/21 Dr Quentin Ore at 8:20 AM as scheduled

## 2021-08-11 LAB — BASIC METABOLIC PANEL
BUN/Creatinine Ratio: 13 (ref 9–20)
BUN: 12 mg/dL (ref 6–20)
CO2: 20 mmol/L (ref 20–29)
Calcium: 9.9 mg/dL (ref 8.7–10.2)
Chloride: 102 mmol/L (ref 96–106)
Creatinine, Ser: 0.92 mg/dL (ref 0.76–1.27)
Glucose: 102 mg/dL — ABNORMAL HIGH (ref 70–99)
Potassium: 4 mmol/L (ref 3.5–5.2)
Sodium: 139 mmol/L (ref 134–144)
eGFR: 110 mL/min/{1.73_m2} (ref >59)

## 2021-08-25 ENCOUNTER — Other Ambulatory Visit: Payer: Self-pay | Admitting: Cardiology

## 2021-08-25 DIAGNOSIS — I493 Ventricular premature depolarization: Secondary | ICD-10-CM

## 2021-09-22 ENCOUNTER — Other Ambulatory Visit: Payer: Self-pay

## 2021-09-22 ENCOUNTER — Ambulatory Visit (INDEPENDENT_AMBULATORY_CARE_PROVIDER_SITE_OTHER): Payer: Self-pay

## 2021-09-22 DIAGNOSIS — I10 Essential (primary) hypertension: Secondary | ICD-10-CM

## 2021-09-22 DIAGNOSIS — I493 Ventricular premature depolarization: Secondary | ICD-10-CM

## 2021-09-22 DIAGNOSIS — I4729 Other ventricular tachycardia: Secondary | ICD-10-CM

## 2021-09-22 LAB — ECHOCARDIOGRAM COMPLETE
AR max vel: 2.92 cm2
AV Area VTI: 3.51 cm2
AV Area mean vel: 2.97 cm2
AV Mean grad: 4 mmHg
AV Peak grad: 7.3 mmHg
Ao pk vel: 1.35 m/s
Area-P 1/2: 4.41 cm2
S' Lateral: 3 cm

## 2021-09-22 MED ORDER — PERFLUTREN LIPID MICROSPHERE
1.0000 mL | INTRAVENOUS | Status: AC | PRN
  Administered 2021-09-22: 2 mL via INTRAVENOUS

## 2021-09-28 NOTE — Progress Notes (Signed)
?Electrophysiology Office Follow up Visit Note:   ? ?Date:  09/29/2021  ? ?ID:  Adele Barthel, DOB 17-Jan-1986, MRN [id number] ? ?PCP:  Patient, No Pcp Per (Inactive)  ?[facility] Cardiologist:  Kathlyn Sacramento, MD  ?[facility] Electrophysiologist:  Vickie Epley, MD  ? ? ?Interval History:   ? ?JAKUB DEBOLD is a 36 y.o. male who presents for a follow up visit. They were last seen in clinic 04/07/2021 for PVCs. He is asymptomatic. He was seen in the ER in January for CP and then saw Ignacia Bayley in clinic f/u. His dilt has been titrated. He is not taking an antiarrhythmic.  ? ?He is not taking his medications reliably.  He tells me he takes his diltiazem about 50% of the time.  He can tell a difference in his PVCs and blood pressure when he does not take the medications. ? ? ?  ? ?Past Medical History:  ?Diagnosis Date  ? Anxiety   ? Chest pain   ? a. 08/2014 ETT: Ex time 6:40. Max HR 169. no ST/T changes; b. 09/2019 Cor CTA: LM nl, LAD nl, LCX nl, RCA nl. Cor Ca2+ = 0.  ? Depression   ? Family history of premature CAD   ? a. in father with CAD s/p CABG in early 24s  ? GERD (gastroesophageal reflux disease)   ? History of echocardiogram   ? a. 09/2019 Echo: EF 55-60%, no rwma, mild LVH; b. 07/2020 Echo: EF 60-65%, no rwma, nl RV fxn.  ? Hyperlipidemia   ? Hypertension   ? Lumbar spinal stenosis   ? herniated discs  ? Morbid obesity ([place])   ? Pituitary adenoma ([place])   ? PVC's (premature ventricular contractions)   ? a. 05/2020 Zio: RSR, 79, 17 short runs of NSVT (longest 5 beats), 32% PVC burden.  ? Sleep apnea   ? uses cpap some nights set oon 7   ? Spinal stenosis   ? Tobacco abuse   ? a. dipping  ? ? ?Past Surgical History:  ?Procedure Laterality Date  ? colonscopy  2-3 yrs ago  ? EYE SURGERY Left   ? secondary to trauma as a child  ? LUMBAR LAMINECTOMY/DECOMPRESSION MICRODISCECTOMY N/A 01/18/2017  ? Procedure: Microlumbar decompression L2-3;  Surgeon: Susa Day, MD;  Location: [facility];  Service:  Orthopedics;  Laterality: N/A;  120 Mins  ? ? ?Current Medications: ?Current Meds  ?Medication Sig  ? cetirizine (ZYRTEC) 10 MG tablet Take 10 mg by mouth daily.  ? diltiazem (CARDIZEM CD) 120 MG 24 hr capsule TAKE 1 CAPSULE (120 MG) BY MOUTH TWICE DAILY. PLEASE KEEP SCHEDULED APPOINTMENT IN OCTOBER  ?  ? ?Allergies:   Patient has no known allergies.  ? ?Social History  ? ?Socioeconomic History  ? Marital status: Married  ?  Spouse name: Not on file  ? Number of children: Not on file  ? Years of education: Not on file  ? Highest education level: Not on file  ?Occupational History  ? Not on file  ?Tobacco Use  ? Smoking status: Never  ? Smokeless tobacco: Current  ?  Types: Chew  ? Tobacco comments:  ?  07/08/2020 "I have never smoked."  ?Vaping Use  ? Vaping Use: Never used  ?Substance and Sexual Activity  ? Alcohol use: Not Currently  ? Drug use: No  ? Sexual activity: Not on file  ?Other Topics Concern  ? Not on file  ?Social History Narrative  ? Not on  file  ? ?Social Determinants of Health  ? ?Financial Resource Strain: Not on file  ?Food Insecurity: Not on file  ?Transportation Needs: Not on file  ?Physical Activity: Not on file  ?Stress: Not on file  ?Social Connections: Not on file  ?  ? ?Family History: ?The patient's family history includes Cancer in his maternal aunt and maternal grandmother; Cancer (age of onset: 68) in his maternal uncle; Heart attack (age of onset: 28) in his father; Heart disease in his father; Hyperlipidemia in his father; Hypertension in his father and mother; Mental illness in his father. ? ?ROS:   ?Please see the history of present illness.    ?All other systems reviewed and are negative. ? ?EKGs/Labs/Other Studies Reviewed:   ? ?The following studies were reviewed today: ? ?09/22/2021 Echo ?EF 60% ?RV nromal ? ? ?EKG:  The ekg ordered today demonstrates sinus rhythm and trigeminal PVCs.  PVCs have a steeply inferior axis and are positive throughout the precordium. ? ?Recent  Labs: ?08/02/2021: Hemoglobin 15.8; Magnesium 2.3; Platelets 251; TSH 3.060 ?08/10/2021: BUN 12; Creatinine, Ser 0.92; Potassium 4.0; Sodium 139  ?Recent Lipid Panel ?   ?Component Value Date/Time  ? CHOL 164 12/23/2016 1050  ? TRIG 93 12/23/2016 1050  ? HDL 33 (L) 12/23/2016 1050  ? CHOLHDL 5.0 12/23/2016 1050  ? CHOLHDL 6.0 03/04/2015 1843  ? VLDL 35 03/04/2015 1843  ? LDLCALC 112 (H) 12/23/2016 1050  ? ? ?Physical Exam:   ? ?VS:  BP (!) 170/108 (BP Location: Left Arm, Patient Position: Sitting, Cuff Size: Large)   Pulse 78   Ht '5\' 10"'$  (1.778 m)   Wt 299 lb (135.6 kg)   SpO2 96%   BMI 42.90 kg/m?    ? ?Wt Readings from Last 3 Encounters:  ?09/29/21 299 lb (135.6 kg)  ?08/10/21 (!) 301 lb (136.5 kg)  ?08/02/21 300 lb (136.1 kg)  ?  ? ?GEN:  Well nourished, well developed in no acute distress.  Obese ?HEENT: Normal ?NECK: No JVD; No carotid bruits ?LYMPHATICS: No lymphadenopathy ?CARDIAC: Irregular rhythm, no murmurs, rubs, gallops ?RESPIRATORY:  Clear to auscultation without rales, wheezing or rhonchi  ?ABDOMEN: Soft, non-tender, non-distended ?MUSCULOSKELETAL:  No edema; No deformity  ?SKIN: Warm and dry ?NEUROLOGIC:  Alert and oriented x 3 ?PSYCHIATRIC:  Normal affect  ? ? ? ?  ? ?ASSESSMENT:   ? ?1. Non-sustained ventricular tachycardia   ?2. Frequent PVCs   ?3. Essential hypertension   ? ?PLAN:   ? ?In order of problems listed above: ? ?#NSVT ?#PVCs ?Originating from the AMC/outflow tract.  Asymptomatic.  Normal left ventricular function continue diltiazem for now. ? ?#Hypertension ?Not controlled ?Recheck manually was 158/80 ?Start hydrochlorothiazide 25 mg by mouth once daily ?Follow-up with APP in 2 weeks for blood work and further titration of his antihypertensive regimen ?Referral to primary care to establish ? ? ?Follow-up with EP in 1 year ? ? ? ?Medication Adjustments/Labs and Tests Ordered: ?Current medicines are reviewed at length with the patient today.  Concerns regarding medicines are outlined  above.  ?No orders of the defined types were placed in this encounter. ? ?No orders of the defined types were placed in this encounter. ? ? ? ?Signed, ?Lars Mage, MD, [place] ?09/29/2021 8:49 AM    ?Electrophysiology ?[place] ?

## 2021-09-29 ENCOUNTER — Other Ambulatory Visit: Payer: Self-pay

## 2021-09-29 ENCOUNTER — Encounter: Payer: Self-pay | Admitting: Cardiology

## 2021-09-29 ENCOUNTER — Ambulatory Visit (INDEPENDENT_AMBULATORY_CARE_PROVIDER_SITE_OTHER): Payer: Self-pay | Admitting: Cardiology

## 2021-09-29 VITALS — BP 170/108 | HR 78 | Ht 70.0 in | Wt 299.0 lb

## 2021-09-29 DIAGNOSIS — I4729 Other ventricular tachycardia: Secondary | ICD-10-CM

## 2021-09-29 DIAGNOSIS — I493 Ventricular premature depolarization: Secondary | ICD-10-CM

## 2021-09-29 DIAGNOSIS — I10 Essential (primary) hypertension: Secondary | ICD-10-CM

## 2021-09-29 MED ORDER — HYDROCHLOROTHIAZIDE 25 MG PO TABS: 25.0000 mg | ORAL_TABLET | Freq: Every day | ORAL | 6 refills | Status: DC

## 2021-09-29 NOTE — Patient Instructions (Signed)
Medication Instructions:  ?- Your physician has recommended you make the following change in your medication:  ? ?1) START Hydrochlorothiazide (HCTZ) 25 mg: ?- take 1 tablet by mouth once daily  ? ?*If you need a refill on your cardiac medications before your next appointment, please call your pharmacy* ? ? ?Lab Work: ?- none ordered ? ?If you have labs (blood work) drawn today and your tests are completely normal, you will receive your results only by: ?MyChart Message (if you have MyChart) OR ?A paper copy in the mail ?If you have any lab test that is abnormal or we need to change your treatment, we will call you to review the results. ? ? ?Testing/Procedures: ?- none ordered ? ? ?Follow-Up: ?At [facility], you and your health needs are our priority.  As part of our continuing mission to provide you with exceptional heart care, we have created designated Provider Care Teams.  These Care Teams include your primary Cardiologist (physician) and Advanced Practice Providers (APPs -  Physician Assistants and Nurse Practitioners) who all work together to provide you with the care you need, when you need it. ? ?We recommend signing up for the patient portal called "MyChart".  Sign up information is provided on this After Visit Summary.  MyChart is used to connect with patients for Virtual Visits (Telemedicine).  Patients are able to view lab/test results, encounter notes, upcoming appointments, etc.  Non-urgent messages can be sent to your provider as well.   ?To learn more about what you can do with MyChart, go to [email or web address].   ? ?Your next appointment:   ?1) 2-3 weeks with a PA/ NP  ? ?2) 1 year with Dr. Quentin Ore ? ?The format for your next appointment:   ?In Person ? ?Provider:   ?As above   ? ? ?Other Instructions ? ? ?1) Please establish with a Primary Care Doctor: ?- Please call [facility] ?[phone] ? ?- Or you may call the [facility]) at [phone] for  assistance with finding a [place]. ? ? ?Hydrochlorothiazide Capsules or Tablets ?What is this medication? ?HYDROCHLOROTHIAZIDE (hye droe klor oh THYE a zide) treats high blood pressure. It may also be used to reduce swelling related to heart, kidney, or liver disease. It helps your kidneys remove more fluid and salt from your blood through the urine. It belongs to a group of medications called diuretics. ?This medicine may be used for other purposes; ask your health care provider or pharmacist if you have questions. ?COMMON BRAND NAME(S): Esidrix, Ezide, HydroDIURIL, Microzide, Oretic, Zide ?What should I tell my care team before I take this medication? ?They need to know if you have any of these conditions: ?Diabetes ?Gout ?Kidney disease ?Liver disease ?Lupus ?Pancreatitis ?An unusual or allergic reaction to hydrochlorothiazide, sulfa drugs, other medications, foods, dyes, or preservatives ?Pregnant or trying to get pregnant ?Breast-feeding ?How should I use this medication? ?Take this medication by mouth. Take it as directed on the prescription label at the same time every day. You can take it with or without food. If it upsets your stomach, take it with food. Keep taking it unless your care team tells you to stop. ?Talk to your care team about the use of this medication in children. While it may be prescribed for children as young as newborns for selected conditions, precautions do apply. ?Overdosage: If you think you have taken too much of this medicine contact a poison control center or emergency room  at once. ?NOTE: This medicine is only for you. Do not share this medicine with others. ?What if I miss a dose? ?If you miss a dose, take it as soon as you can. If it is almost time for your next dose, take only that dose. Do not take double or extra doses. ?What may interact with this medication? ?Cholestyramine ?Colestipol ?Digoxin ?Dofetilide ?Lithium ?Medications for blood pressure ?Medications  for diabetes ?Medications that relax muscles for surgery ?Other diuretics ?Steroid medications like prednisone or cortisone ?This list may not describe all possible interactions. Give your health care provider a list of all the medicines, herbs, non-prescription drugs, or dietary supplements you use. Also tell them if you smoke, drink alcohol, or use illegal drugs. Some items may interact with your medicine. ?What should I watch for while using this medication? ?Visit your health care provider for regular check-ups. Check your blood pressure as directed. Ask your health care provider what your blood pressure should be. Also, find out when you should contact him or her. ?Do not treat yourself for coughs, colds, or pain while you are using this medication without asking your health care provider for advice. Some medications may increase your blood pressure. ?You may get drowsy or dizzy. Do not drive, use machinery, or do anything that needs mental alertness until you know how this medication affects you. Do not stand or sit up quickly, especially if you are an older patient. This reduces the risk of dizzy or fainting spells. Alcohol can make you more drowsy and dizzy. Avoid alcoholic drinks. ?Talk to your health care professional about your risk of skin cancer. You may be more at risk for skin cancer if you take this medication. ?This medication can make you more sensitive to the sun. Keep out of the sun. If you cannot avoid being in the sun, wear protective clothing and use sunscreen. Do not use sun lamps or tanning beds/booths. ?You may need to be on a special diet while taking this medication. Ask your health care provider. Also, find out how many glasses of fluids you need to drink each day. ?Check with your health care provider if you get an attack of severe diarrhea, nausea and vomiting, or if you sweat a lot. The loss of too much body fluid can make it dangerous for you to take this medication. ?This medication  may increase blood sugar. Ask your healthcare provider if changes in diet or medications are needed if you have diabetes. ?What side effects may I notice from receiving this medication? ?Side effects that you should report to your care team as soon as possible: ?Allergic reactions--skin rash, itching, hives, swelling of the face, lips, tongue, or throat ?Dehydration--increased thirst, dry mouth, feeling faint or lightheaded, headache, dark yellow or brown urine ?Gout--severe pain, redness, warmth, or swelling in joints, such as the big toe ?Kidney injury--decrease in the amount of urine, swelling of the ankles, hands, or feet ?Low blood pressure--dizziness, feeling faint or lightheaded, blurry vision ?Low potassium level--muscle pain or cramps, unusual weakness, fatigue, fast or irregular heartbeat, constipation ?Sudden eye pain or change in vision such as blurred vision, seeing halos around lights, vision loss ?Side effects that usually do not require medical attention (report to your care team if they continue or are bothersome): ?Change in sex drive or performance ?Headache ?Upset stomach ?This list may not describe all possible side effects. Call your doctor for medical advice about side effects. You may report side effects to FDA at 1-800-FDA-1088. ?Where  should I keep my medication? ?Keep out of the reach of children and pets. ?Store at room temperature between 20 and 25 degrees C (68 and 77 degrees F). Protect from light and moisture. Keep the container tightly closed. Do not freeze. Get rid of any unused medication after the expiration date. ?To get rid of medications that are no longer needed or have expired: ?Take the medication to a medication take-back program. Check with your pharmacy or law enforcement to find a location. ?If you cannot return the medication, check the label or package insert to see if the medication should be thrown out in the garbage or flushed down the toilet. If you are not sure,  ask your care team. If it is safe to put in the trash, empty the medication out of the container. Mix the medication with cat litter, dirt, coffee grounds, or other unwanted substance. Seal the mixture

## 2021-10-20 ENCOUNTER — Ambulatory Visit: Payer: Self-pay | Admitting: Nurse Practitioner

## 2021-10-20 ENCOUNTER — Encounter: Payer: Self-pay | Admitting: Nurse Practitioner

## 2021-10-20 NOTE — Progress Notes (Incomplete)
? ? ?Office Visit  ?  ?Patient Name: Seth Parker ?Date of Encounter: 10/20/2021 ? ?Primary Care Provider:  Patient, No Pcp Per (Inactive) ?Primary Cardiologist:  Kathlyn Sacramento, MD ? ?Chief Complaint  ?  ?36 year old male with a history of symptomatic PVCs, obesity, hypertension, hyperlipidemia, sleep apnea, and chest pain with normal coronary arteries by coronary CTA (March 2021), who presents for follow-up for PVCs. ? ?Past Medical History  ?  ?Past Medical History:  ?Diagnosis Date  ? Anxiety   ? Chest pain   ? a. 08/2014 ETT: Ex time 6:40. Max HR 169. no ST/T changes; b. 09/2019 Cor CTA: LM nl, LAD nl, LCX nl, RCA nl. Cor Ca2+ = 0.  ? Depression   ? Family history of premature CAD   ? a. in father with CAD s/p CABG in early 47s  ? GERD (gastroesophageal reflux disease)   ? History of echocardiogram   ? a. 09/2019 Echo: EF 55-60%, no rwma, mild LVH; b. 07/2020 Echo: EF 60-65%, no rwma, nl RV fxn; c. 09/2021 Echo: EF 60-65%, no rwma, nl RV fxn.  ? Hyperlipidemia   ? Hypertension   ? Lumbar spinal stenosis   ? herniated discs  ? Morbid obesity ([place])   ? Pituitary adenoma ([place])   ? PVC's (premature ventricular contractions)   ? a. 05/2020 Zio: RSR, 79, 17 short runs of NSVT (longest 5 beats), 32% PVC burden.  ? Sleep apnea   ? uses cpap some nights set oon 7   ? Spinal stenosis   ? Tobacco abuse   ? a. dipping  ? ?Past Surgical History:  ?Procedure Laterality Date  ? colonscopy  2-3 yrs ago  ? EYE SURGERY Left   ? secondary to trauma as a child  ? LUMBAR LAMINECTOMY/DECOMPRESSION MICRODISCECTOMY N/A 01/18/2017  ? Procedure: Microlumbar decompression L2-3;  Surgeon: Susa Day, MD;  Location: [facility];  Service: Orthopedics;  Laterality: N/A;  120 Mins  ? ? ?Allergies ? ?No Known Allergies ? ?History of Present Illness  ?  ?36 year old male with above past medical history including symptomatic PVCs, obesity, hypertension, hyperlipidemia, sleep apnea, and chest pain.  He also has a family history of premature CAD  involving his father (at age 18).  In the setting of atypical chest pain, he underwent exercise treadmill testing in February 2016, which was normal.  Following COVID-19 infection in January 2021, he was evaluated for chest pain in the emergency department and was subsequently noted to have frequent PVCs.  Echocardiogram in March 2021 showed normal LV function.  Coronary CT angiogram was carried out and showed normal coronary arteries with a calcium score of 0.  He subsequently underwent event monitoring in November 2022 which showed a 32% PVC burden.  He was placed on diltiazem and subsequently seen by electrophysiology with titration of diltiazem to 120 mg twice daily in January 2022.  Echocardiogram at that time showed an EF of 6065% with normal R RV function.  Patient was doing well when he was seen at cardiology visit April 07, 2021. ?  ?Unfortunately, in late January, patient had a 2 to 3-day episode of intermittent chest pain, nausea, and dizziness.  He noted more frequent PVCs on his Apple Watch as well and presented to the emergency department on January 30.  There, he was hypertensive.  Lab work notable for borderline hypokalemia with potassium 3.6 and he was supplemented.  His troponins were normal as was TSH and blood counts.  A head CT was  performed and was normal.  Chest x-ray showed no acute process.  After discussion with on-call cardiology, he was advised to increase his diltiazem to 240 mg twice daily. He followed up in cardiology clinic on August 10, 2021. At that time he reported no recurrent symptoms. Did report occasional PVCs on his apple watch.  ? ?Today, patient presents for follow-up for his PVCs.  He *** chest pain, dyspnea, PND, orthopnea, edema, or early satiety. Reports *** occasional palpitations. *** diet and exercise.   ? ?Home Medications  ?  ?Current Outpatient Medications  ?Medication Sig Dispense Refill  ? cetirizine (ZYRTEC) 10 MG tablet Take 10 mg by mouth daily.    ?  diltiazem (CARDIZEM CD) 120 MG 24 hr capsule TAKE 1 CAPSULE (120 MG) BY MOUTH TWICE DAILY. PLEASE KEEP SCHEDULED APPOINTMENT IN OCTOBER 180 capsule 1  ? hydrochlorothiazide (HYDRODIURIL) 25 MG tablet Take 1 tablet (25 mg total) by mouth daily. 30 tablet 6  ? ?No current facility-administered medications for this visit.  ?  ? ?Review of Systems  ?  ?***.  All other systems reviewed and are otherwise negative except as noted above. ?  ? ?Physical Exam  ?  ?VS:  There were no vitals taken for this visit. , BMI There is no height or weight on file to calculate BMI. ?    ?GEN: Well nourished, well developed, in no acute distress. ?HEENT: normal. ?Neck: Supple, no JVD, carotid bruits, or masses. ?Cardiac: RRR, no murmurs, rubs, or gallops. No clubbing, cyanosis, edema.  Radials/DP/PT 2+ and equal bilaterally.  ?Respiratory:  Respirations regular and unlabored, clear to auscultation bilaterally. ?GI: Soft, nontender, nondistended, BS + x 4. ?MS: no deformity or atrophy. ?Skin: warm and dry, no rash. ?Neuro:  Strength and sensation are intact. ?Psych: Normal affect. ? ?Accessory Clinical Findings  ?  ?ECG personally reviewed by me today - *** - no acute changes. ? ?Lab Results  ?Component Value Date  ? WBC 7.4 08/02/2021  ? HGB 15.8 08/02/2021  ? HCT 47.5 08/02/2021  ? MCV 89.1 08/02/2021  ? PLT 251 08/02/2021  ? ?Lab Results  ?Component Value Date  ? CREATININE 0.92 08/10/2021  ? BUN 12 08/10/2021  ? NA 139 08/10/2021  ? K 4.0 08/10/2021  ? CL 102 08/10/2021  ? CO2 20 08/10/2021  ? ?Lab Results  ?Component Value Date  ? ALT 34 08/04/2019  ? AST 32 08/04/2019  ? GGT 28 12/23/2016  ? ALKPHOS 50 08/04/2019  ? BILITOT 1.1 08/04/2019  ? ?Lab Results  ?Component Value Date  ? CHOL 164 12/23/2016  ? HDL 33 (L) 12/23/2016  ? LDLCALC 112 (H) 12/23/2016  ? TRIG 93 12/23/2016  ? CHOLHDL 5.0 12/23/2016  ?  ?Lab Results  ?Component Value Date  ? HGBA1C 5.1 08/30/2016  ? ? ?Assessment & Plan  ?  ?1.  *** ? ? ?Murray Hodgkins,  NP ?10/20/2021, 1:50 PM ? ?

## 2021-10-21 ENCOUNTER — Encounter: Payer: Self-pay | Admitting: Nurse Practitioner

## 2022-05-09 IMAGING — CT CT HEAD W/O CM
4 series · 16 of 47 positions shown, 18 images · non-contrast
Comparison: 07/24/2007

CLINICAL DATA: Dizziness, nausea, near-syncope



[Series 2: head wo · axial · 0.46mm/px · z∈[-124,+1]mm · 7 of 35 slices shown, 9 images]
[im 5/35  brain]
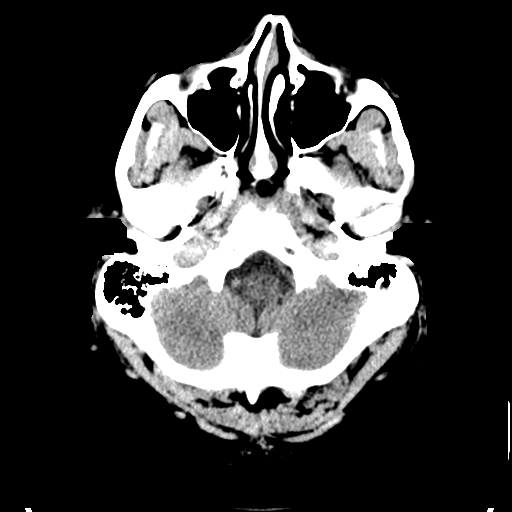
[im 5/35  bone]
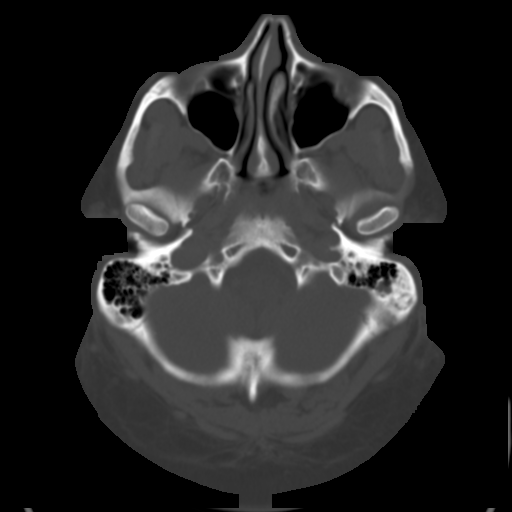
[im 9/35  brain]
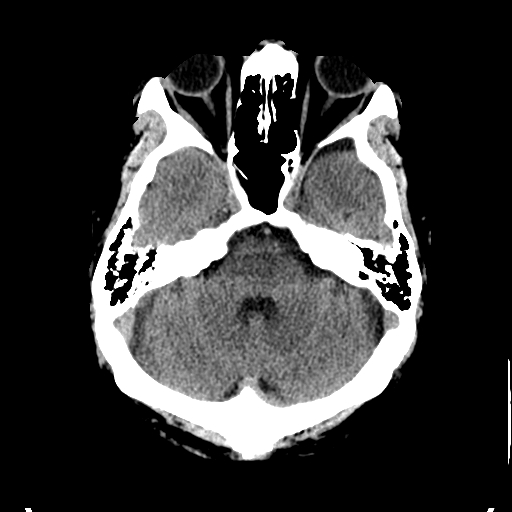
[im 13/35  brain]
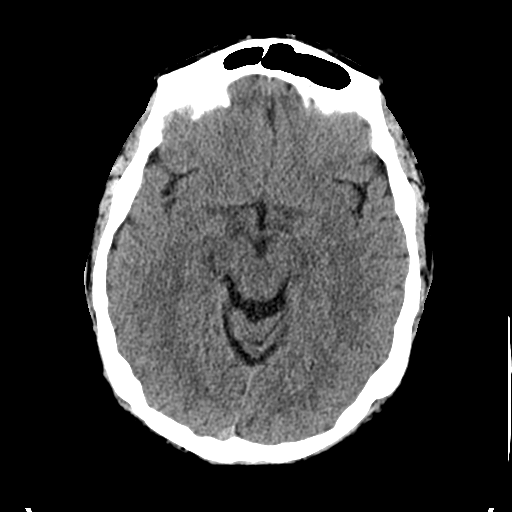
[im 18/35  brain]
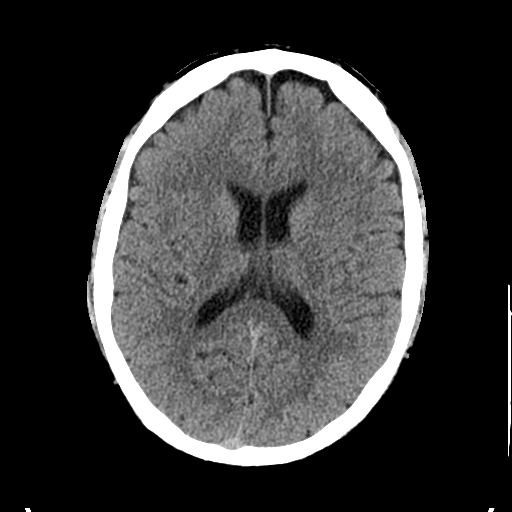
[im 22/35  brain]
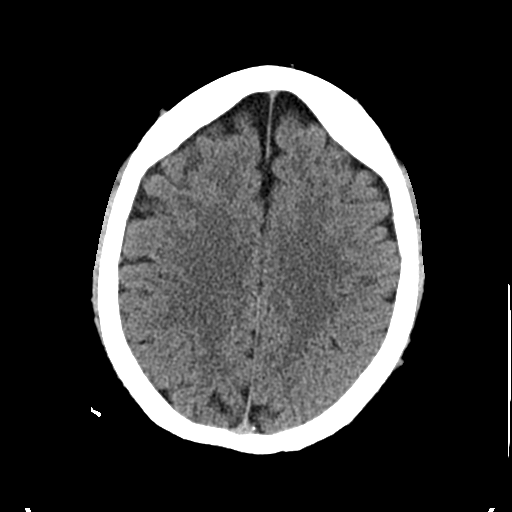
[im 22/35  bone]
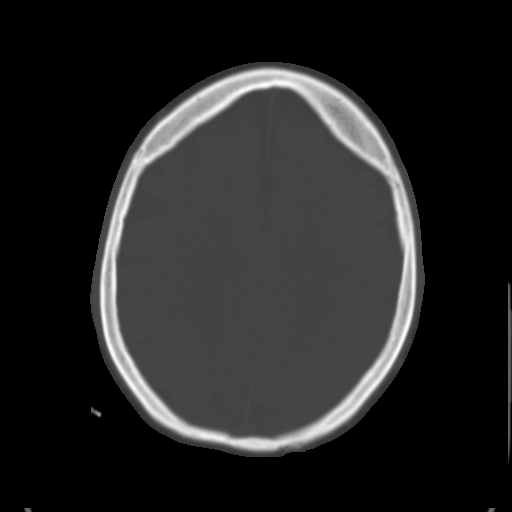
[im 26/35  brain]
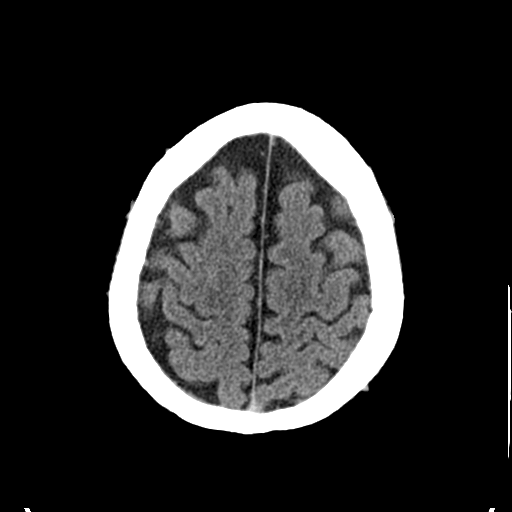
[im 30/35  brain]
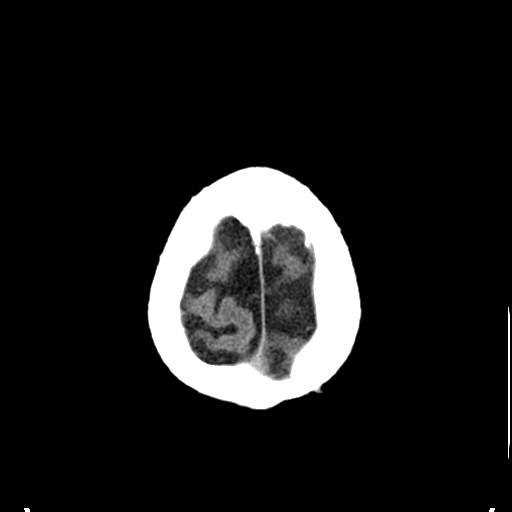

[Series 3: head bone · axial · 0.46mm/px · z∈[-128,-94]mm · 3 of 86 slices shown]
[im 9/86  bone]
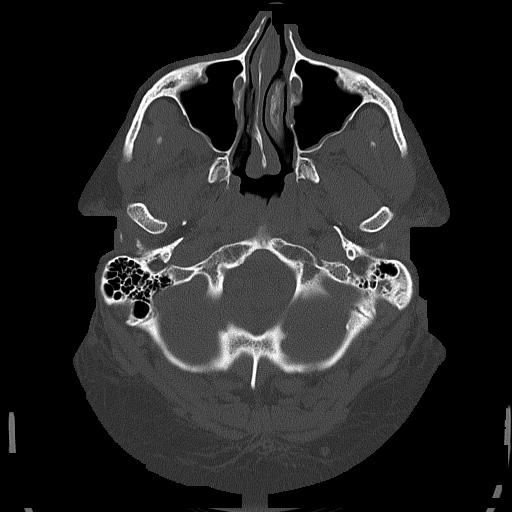
[im 18/86  bone]
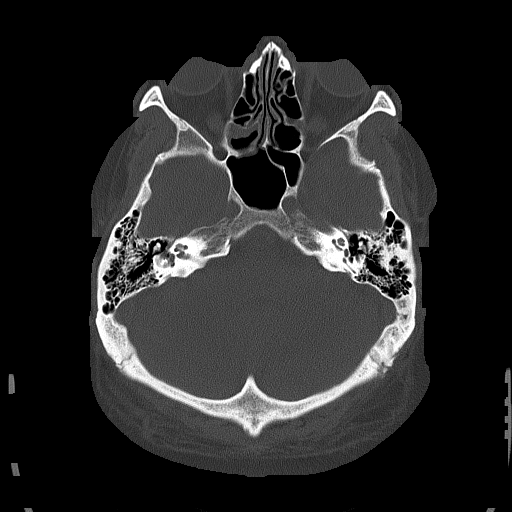
[im 26/86  bone]
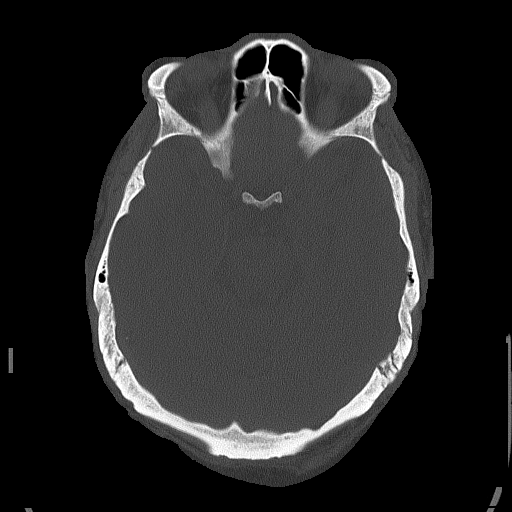

[Series 4: coronal soft tissue · coronal · 0.33mm/px · 3 of 78 slices shown]
[im 26/78  brain]
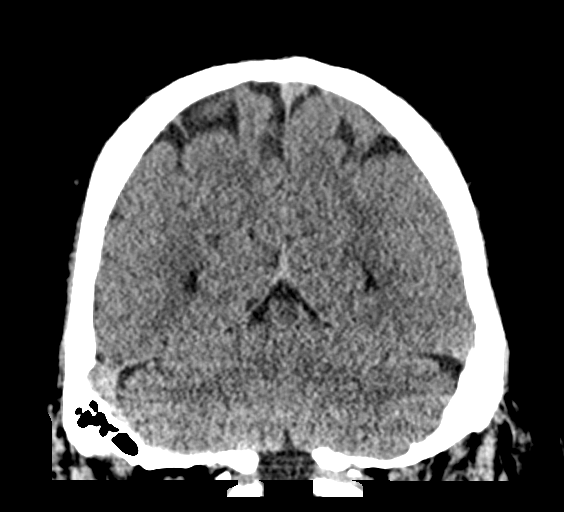
[im 35/78  brain]
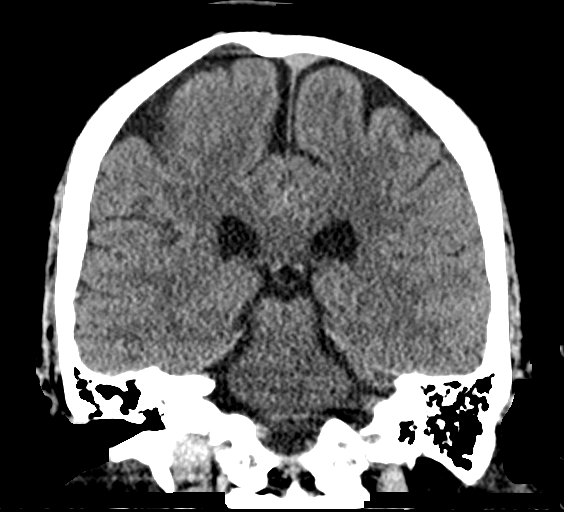
[im 43/78  brain]
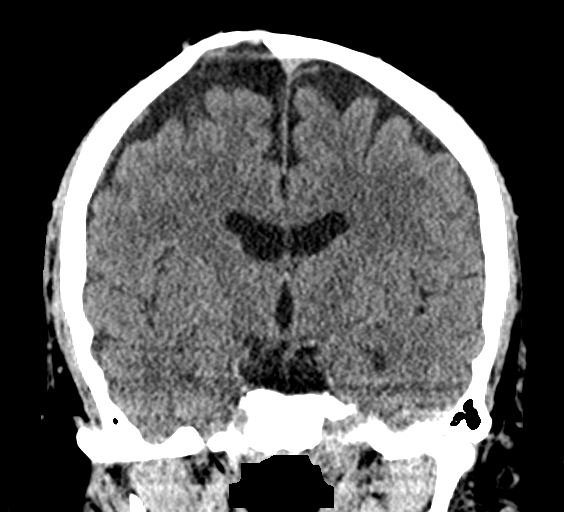

[Series 5: sagittal soft tissue · sagittal · 0.33mm/px · 3 of 62 slices shown]
[im 21/62  brain]
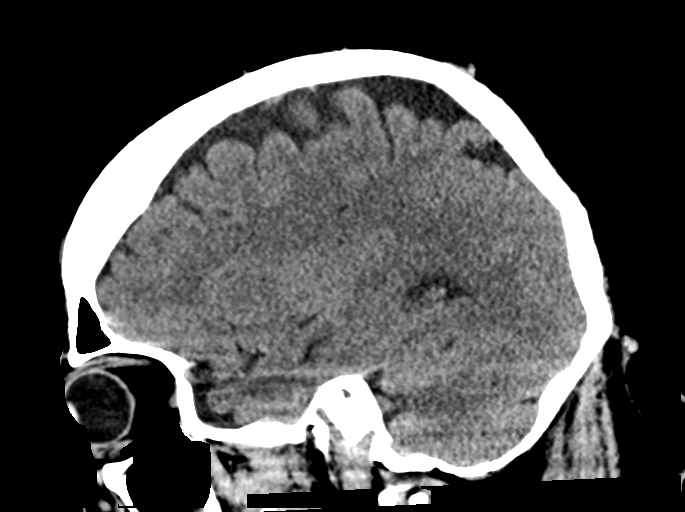
[im 31/62  brain]
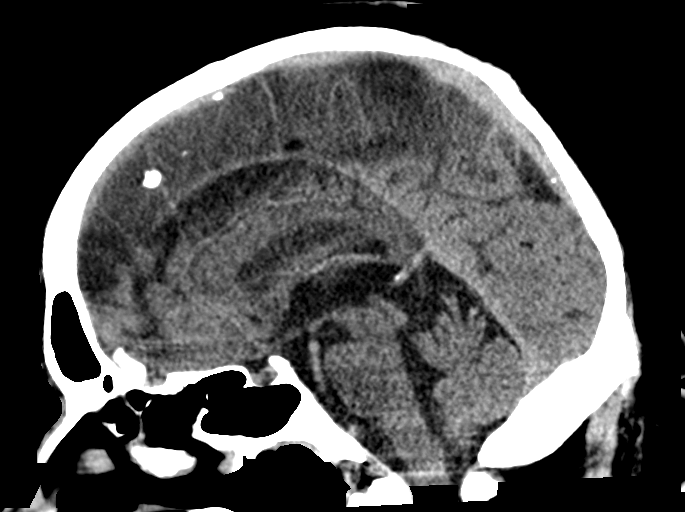
[im 41/62  brain]
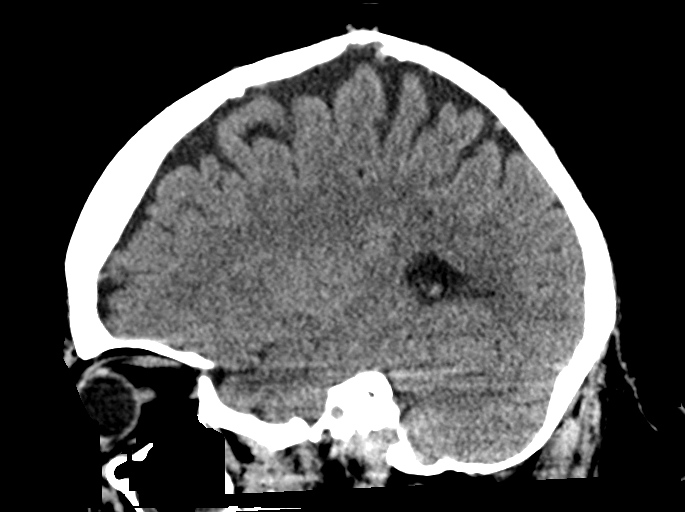

[16 of 47 positions shown; findings below may reference images not displayed]

FINDINGS: Brain: No evidence of acute infarction, hemorrhage, hydrocephalus,
extra-axial collection or mass lesion/mass effect.

Vascular: No hyperdense vessel or unexpected calcification.

Skull: Normal. Negative for fracture or focal lesion.

Sinuses/Orbits: Mucosal thickening of the bilateral maxillary
sinuses. Visualized paranasal sinuses and mastoid air cells are
otherwise clear.

Other: None.
IMPRESSION: Normal head CT.

## 2022-07-07 ENCOUNTER — Other Ambulatory Visit: Payer: Self-pay | Admitting: Nurse Practitioner

## 2022-07-07 ENCOUNTER — Other Ambulatory Visit: Payer: Self-pay | Admitting: Cardiology

## 2022-07-07 DIAGNOSIS — I493 Ventricular premature depolarization: Secondary | ICD-10-CM

## 2022-07-07 NOTE — Telephone Encounter (Signed)
Patient schedule for feb

## 2022-07-07 NOTE — Telephone Encounter (Signed)
Please contact patient for overdue follow up with NP/PA.  Last seen my Seth Parker 09-29-21, did not schedule 2-3 week f/u.  Refill pending f/u

## 2022-08-03 ENCOUNTER — Emergency Department: Payer: BC Managed Care – PPO

## 2022-08-03 ENCOUNTER — Emergency Department
Admission: EM | Admit: 2022-08-03 | Discharge: 2022-08-03 | Discharge status: Against Medical Advice | Payer: BC Managed Care – PPO | Attending: Emergency Medicine | Admitting: Emergency Medicine

## 2022-08-03 DIAGNOSIS — E876 Hypokalemia: Secondary | ICD-10-CM | POA: Insufficient documentation

## 2022-08-03 DIAGNOSIS — R110 Nausea: Secondary | ICD-10-CM | POA: Diagnosis not present

## 2022-08-03 DIAGNOSIS — R0789 Other chest pain: Secondary | ICD-10-CM | POA: Diagnosis not present

## 2022-08-03 DIAGNOSIS — I493 Ventricular premature depolarization: Secondary | ICD-10-CM | POA: Insufficient documentation

## 2022-08-03 LAB — CBC WITH DIFFERENTIAL/PLATELET
Abs Immature Granulocytes: 0.08 10*3/uL — ABNORMAL HIGH (ref 0.00–0.07)
Basophils Absolute: 0.1 10*3/uL (ref 0.0–0.1)
Basophils Relative: 0 %
Eosinophils Absolute: 0.2 10*3/uL (ref 0.0–0.5)
Eosinophils Relative: 2 %
HCT: 46.3 % (ref 39.0–52.0)
Hemoglobin: 15.7 g/dL (ref 13.0–17.0)
Immature Granulocytes: 1 %
Lymphocytes Relative: 26 %
Lymphs Abs: 3 10*3/uL (ref 0.7–4.0)
MCH: 29.3 pg (ref 26.0–34.0)
MCHC: 33.9 g/dL (ref 30.0–36.0)
MCV: 86.5 fL (ref 80.0–100.0)
Monocytes Absolute: 0.9 10*3/uL (ref 0.1–1.0)
Monocytes Relative: 8 %
Neutro Abs: 7.4 10*3/uL (ref 1.7–7.7)
Neutrophils Relative %: 63 %
Platelets: 264 10*3/uL (ref 150–400)
RBC: 5.35 MIL/uL (ref 4.22–5.81)
RDW: 12.1 % (ref 11.5–15.5)
WBC: 11.6 10*3/uL — ABNORMAL HIGH (ref 4.0–10.5)
nRBC: 0 % (ref 0.0–0.2)

## 2022-08-03 LAB — COMPREHENSIVE METABOLIC PANEL
ALT: 41 U/L (ref 0–44)
AST: 27 U/L (ref 15–41)
Albumin: 3.9 g/dL (ref 3.5–5.0)
Alkaline Phosphatase: 43 U/L (ref 38–126)
Anion gap: 7 (ref 5–15)
BUN: 17 mg/dL (ref 6–20)
CO2: 28 mmol/L (ref 22–32)
Calcium: 9.9 mg/dL (ref 8.9–10.3)
Chloride: 102 mmol/L (ref 98–111)
Creatinine, Ser: 0.97 mg/dL (ref 0.61–1.24)
GFR, Estimated: >60 mL/min (ref >60)
Glucose, Bld: 106 mg/dL — ABNORMAL HIGH (ref 70–99)
Potassium: 3.4 mmol/L — ABNORMAL LOW (ref 3.5–5.1)
Sodium: 137 mmol/L (ref 135–145)
Total Bilirubin: 0.8 mg/dL (ref 0.3–1.2)
Total Protein: 7.1 g/dL (ref 6.5–8.1)

## 2022-08-03 LAB — MAGNESIUM: Magnesium: 2.5 mg/dL — ABNORMAL HIGH (ref 1.7–2.4)

## 2022-08-03 LAB — TROPONIN I (HIGH SENSITIVITY): Troponin I (High Sensitivity): 6 ng/L (ref <18)

## 2022-08-03 MED ORDER — POTASSIUM CHLORIDE CRYS ER 20 MEQ PO TBCR
40.0000 meq | EXTENDED_RELEASE_TABLET | Freq: Once | ORAL | Status: AC
  Administered 2022-08-03: 40 meq via ORAL
  Filled 2022-08-03: qty 2

## 2022-08-03 NOTE — ED Provider Notes (Signed)
[facility] Provider Note    Event Date/Time   First MD Initiated Contact with Patient 08/03/22 0016     (approximate)   History   Chest Pain   HPI  Seth Parker is a 37 y.o. male who presents to the ED for evaluation of Chest Pain   I review EP cardiology clinic visit from 09/2021.  Morbidly obese patient with history of frequent and symptomatic PVCs prescribed diltiazem, also with HTN on HCTZ.  Patient presents to the ED for evaluation of intermittent chest pain over the past couple days.  He reports stabbing and burning right-sided chest pain intermittently.  Reports 20-1000 episodes per day, brief and fleeting.  Sometimes he feels nauseous with them.  No syncope, emesis, abdominal pain, fever, cough.  No postprandial changes to his symptoms  He eventually tells me that he has not been taking his diltiazem at home for the past 3 to 4 days "because I forgot" but does report taking a typical dose earlier tonight just in the past couple hours.  Reports no chest pain right now.  Physical Exam   Triage Vital Signs: ED Triage Vitals  Enc Vitals Group     BP 08/03/22 0010 (!) 194/114     Pulse Rate 08/03/22 0010 81     Resp 08/03/22 0010 18     Temp 08/03/22 0010 98.3 F (36.8 C)     Temp Source 08/03/22 0010 Oral     SpO2 08/03/22 0010 98 %     Weight 08/03/22 0008 297 lb 9.9 oz (135 kg)     Height 08/03/22 0008 '5\' 10"'$  (1.778 m)     Head Circumference --      Peak Flow --      Pain Score 08/03/22 0012 10     Pain Loc --      Pain Edu? --      Excl. in [place]? --     Most recent vital signs: Vitals:   08/03/22 0059 08/03/22 0201  BP: (!) 141/68 133/79  Pulse: 85 72  Resp: 20 20  Temp:    SpO2: 96% 95%    General: Awake, no distress.  Morbidly obese.  Look systemically well. CV:  Good peripheral perfusion.  Frequent PVCs on the monitor Resp:  Normal effort.  Abd:  No distention.  MSK:  No deformity noted.  Neuro:  No focal deficits  appreciated. Other:     ED Results / Procedures / Treatments   Labs (all labs ordered are listed, but only abnormal results are displayed) Labs Reviewed  CBC WITH DIFFERENTIAL/PLATELET - Abnormal; Notable for the following components:      Result Value   WBC 11.6 (*)    Abs Immature Granulocytes 0.08 (*)    All other components within normal limits  COMPREHENSIVE METABOLIC PANEL - Abnormal; Notable for the following components:   Potassium 3.4 (*)    Glucose, Bld 106 (*)    All other components within normal limits  MAGNESIUM - Abnormal; Notable for the following components:   Magnesium 2.5 (*)    All other components within normal limits  TROPONIN I (HIGH SENSITIVITY)  TROPONIN I (HIGH SENSITIVITY)    EKG Sinus rhythm with a rate of 79 bpm.  Normal axis.  For PVCs.  No clear signs of acute ischemia.  RADIOLOGY CXR interpreted by me without evidence of acute cardiopulmonary pathology.  Official radiology report(s): DG Chest 2 View  Result Date: 08/03/2022 CLINICAL DATA:  Chest pain EXAM: CHEST - 2 VIEW COMPARISON:  08/02/2021 FINDINGS: The heart size and mediastinal contours are within normal limits. Both lungs are clear. The visualized skeletal structures are unremarkable. IMPRESSION: No active cardiopulmonary disease. Electronically Signed   By: Inez Catalina M.D.   On: 08/03/2022 00:30    PROCEDURES and INTERVENTIONS:  .1-3 Lead EKG Interpretation  Performed by: Vladimir Crofts, MD Authorized by: Vladimir Crofts, MD     Interpretation: normal     ECG rate:  78   ECG rate assessment: normal     Rhythm: sinus rhythm     Ectopy: PVCs     Conduction: normal     Medications  potassium chloride SA (KLOR-CON M) CR tablet 40 mEq (40 mEq Oral Given 08/03/22 0129)     IMPRESSION / MDM / ASSESSMENT AND PLAN / ED COURSE  I reviewed the triage vital signs and the nursing notes.  Differential diagnosis includes, but is not limited to, ACS, PTX, PNA, muscle strain/spasm, PE,  dissection  {Patient presents with symptoms of an acute illness or injury that is potentially life-threatening.  37 year old male with history of HTN and frequent PVCs presents with chest discomfort, possibly symptomatic frequent PVCs in the setting of hypokalemia, and ultimately leaving AMA prior to his second troponin.  Look systemically well and asymptomatic.  Frequent PVCs noted on the monitor and his twelve-lead, but no evidence of significant or high-grade AV block or ischemic features.  First troponin is negative.  Hypokalemia is repleted orally.  Mild and nonspecific leukocytosis is noted.  Discharged with return precautions and recommendations to follow-up with cardiology as an outpatient.  We discussed the importance of actually taking his medications and we discussed return precautions  Clinical Course as of 08/03/22 0309  Wed Aug 03, 2022  0134 Reassessed.  Patient reports no pain.  Wife at the bedside.  We discussed workup so far and plan of care.  He is in agreement. [DS]  2334 Patient refusing second troponin draw and requesting discharge.  Reports he feels fine does not need to stay. [DS]    Clinical Course User Index [DS] Vladimir Crofts, MD     FINAL CLINICAL IMPRESSION(S) / ED DIAGNOSES   Final diagnoses:  Other chest pain  Frequent PVCs  Hypokalemia     Rx / DC Orders   ED Discharge Orders     None        Note:  This document was prepared using Dragon voice recognition software and may include unintentional dictation errors.   Vladimir Crofts, MD 08/03/22 [phone]

## 2022-08-03 NOTE — Discharge Instructions (Addendum)
Take your medicine

## 2022-08-03 NOTE — ED Triage Notes (Signed)
Patient arrived via EMS from home. States he has had intermittent chest pain for the past three days. States pain has increased in severity and frequency tonight and states pain is 8/10 during episodes of pain. 324 mg aspirin and one nitro given en route. 18 G left AC. Patient AOX4. Resp even, unlabored on RA. Speaking in full clear sentences.

## 2022-08-03 NOTE — ED Notes (Signed)
Patient pulled monitor off, told secretary he wants his papers. RN at bedside and informed patient that it was time for repeat cardiac enzyme to be drawn. Patient states that he does not want lab drawn, states he wants to leave against medical advice.

## 2022-08-17 ENCOUNTER — Encounter: Payer: Self-pay | Admitting: Cardiology

## 2022-08-17 ENCOUNTER — Ambulatory Visit: Payer: BC Managed Care – PPO | Attending: Cardiology | Admitting: Cardiology

## 2022-08-17 VITALS — BP 144/78 | HR 81 | Ht 70.0 in | Wt 297.0 lb

## 2022-08-17 DIAGNOSIS — I10 Essential (primary) hypertension: Secondary | ICD-10-CM | POA: Diagnosis not present

## 2022-08-17 DIAGNOSIS — I493 Ventricular premature depolarization: Secondary | ICD-10-CM

## 2022-08-17 MED ORDER — DILTIAZEM HCL ER COATED BEADS 120 MG PO CP24: 240.0000 mg | ORAL_CAPSULE | Freq: Every day | ORAL | 3 refills | Status: DC

## 2022-08-17 MED ORDER — POTASSIUM CHLORIDE CRYS ER 20 MEQ PO TBCR: 20.0000 meq | EXTENDED_RELEASE_TABLET | Freq: Every day | ORAL | 3 refills | Status: DC

## 2022-08-17 MED ORDER — MAGNESIUM OXIDE 400 MG PO TABS: 400.0000 mg | ORAL_TABLET | Freq: Every day | ORAL | 3 refills | Status: AC

## 2022-08-17 NOTE — Patient Instructions (Signed)
Medication Instructions:  Your physician has recommended you make the following change in your medication:  1) START taking magnesium oxide 400 mg daily  2) START taking Kdur (potassium chloride) 20 meq daily  *If you need a refill on your cardiac medications before your next appointment, please call your pharmacy*  Follow-Up: At [facility], you and your health needs are our priority.  As part of our continuing mission to provide you with exceptional heart care, we have created designated Provider Care Teams.  These Care Teams include your primary Cardiologist (physician) and Advanced Practice Providers (APPs -  Physician Assistants and Nurse Practitioners) who all work together to provide you with the care you need, when you need it.  Your next appointment:   6 month(s)  Provider:   You will see one of the following Advanced Practice Providers on your designated Care Team:   Tommye Standard, [place]" [place], [place], NP

## 2022-08-17 NOTE — Progress Notes (Signed)
Electrophysiology Office Follow up Visit Note:    Date:  08/17/2022   ID:  Seth Parker, DOB Jan 28, 1986, MRN [id number]  PCP:  Patient, No Pcp Per  [place] Cardiologist:  Kathlyn [place], MD  [facility] Electrophysiologist:  Vickie Epley, MD    Interval History:    Seth Parker is a 37 y.o. male who presents for a follow up visit. They were last seen in clinic September 29, 2021 for NSVT and PVCs.  He also had uncontrolled blood pressure and was started on hydrochlorothiazide at the last appointment.  He presented to the hospital recently with chest pain.  He says he was at home and started feeling pain in his chest that progressively worsened.  His ectopy started to worsen as well.  He asked his buddies who were on duty with EMS to come by the house and run a twelve-lead.  He was in bigeminy.  His systolic blood pressure was in the 220s.  They transported him to the hospital.  There his blood pressure slowly returned to normal.  His ectopy also improved with potassium supplementation.  He says that he had been out of his hydrochlorothiazide and diltiazem for several days.      Past Medical History:  Diagnosis Date   Anxiety    Chest pain    a. 08/2014 ETT: Ex time 6:40. Max HR 169. no ST/T changes; b. 09/2019 Cor CTA: LM nl, LAD nl, LCX nl, RCA nl. Cor Ca2+ = 0.   Depression    Family history of premature CAD    a. in father with CAD s/p CABG in early 40s   GERD (gastroesophageal reflux disease)    History of echocardiogram    a. 09/2019 Echo: EF 55-60%, no rwma, mild LVH; b. 07/2020 Echo: EF 60-65%, no rwma, nl RV fxn; c. 09/2021 Echo: EF 60-65%, no rwma, nl RV fxn.   Hyperlipidemia    Hypertension    Lumbar spinal stenosis    herniated discs   Morbid obesity (HCC)    Pituitary adenoma (HCC)    PVC's (premature ventricular contractions)    a. 05/2020 Zio: RSR, 79, 17 short runs of NSVT (longest 5 beats), 32% PVC burden.   Sleep apnea    uses cpap some nights set oon  7    Spinal stenosis    Tobacco abuse    a. dipping    Past Surgical History:  Procedure Laterality Date   colonscopy  2-3 yrs ago   EYE SURGERY Left    secondary to trauma as a child   LUMBAR LAMINECTOMY/DECOMPRESSION MICRODISCECTOMY N/A 01/18/2017   Procedure: Microlumbar decompression L2-3;  Surgeon: Susa Day, MD;  Location: [facility];  Service: Orthopedics;  Laterality: N/A;  120 Mins    Current Medications: Current Meds  Medication Sig   cetirizine (ZYRTEC) 10 MG tablet Take 10 mg by mouth daily.   hydrochlorothiazide (HYDRODIURIL) 25 MG tablet Take 1 tablet (25 mg total) by mouth daily.   LAGEVRIO 200 MG CAPS capsule Take 4 capsules by mouth 2 (two) times daily.   magnesium oxide (MAG-OX) 400 MG tablet Take 1 tablet (400 mg total) by mouth daily.   potassium chloride SA (KLOR-CON M) 20 MEQ tablet Take 1 tablet (20 mEq total) by mouth daily.   [DISCONTINUED] diltiazem (CARDIZEM CD) 120 MG 24 hr capsule Take 1 capsule (120 mg total) by mouth daily. Not additional refill until office visit. (Patient taking differently: Take 240 mg by mouth daily. Not  additional refill until office visit.)     Allergies:   Patient has no known allergies.   Social History   Socioeconomic History   Marital status: Married    Spouse name: Not on file   Number of children: Not on file   Years of education: Not on file   Highest education level: Not on file  Occupational History   Not on file  Tobacco Use   Smoking status: Never   Smokeless tobacco: Current    Types: Chew   Tobacco comments:    07/08/2020 "I have never smoked."  Vaping Use   Vaping Use: Never used  Substance and Sexual Activity   Alcohol use: Not Currently   Drug use: No   Sexual activity: Not on file  Other Topics Concern   Not on file  Social History Narrative   Not on file   Social Determinants of Health   Financial Resource Strain: Not on file  Food Insecurity: Not on file  Transportation Needs: Not on file   Physical Activity: Not on file  Stress: Not on file  Social Connections: Not on file     Family History: The patient's family history includes Cancer in his maternal aunt and maternal grandmother; Cancer (age of onset: 51) in his maternal uncle; Heart attack (age of onset: 75) in his father; Heart disease in his father; Hyperlipidemia in his father; Hypertension in his father and mother; Mental illness in his father.  ROS:   Please see the history of present illness.    All other systems reviewed and are negative.  EKGs/Labs/Other Studies Reviewed:    The following studies were reviewed today:   EKG:  The ekg ordered today demonstrates sinus rhythm with frequent monomorphic ventricular ectopy.  Ectopy has a steeply inferior axis and is positive throughout the precordium.  Recent Labs: 08/03/2022: ALT 41; BUN 17; Creatinine, Ser 0.97; Hemoglobin 15.7; Magnesium 2.5; Platelets 264; Potassium 3.4; Sodium 137  Recent Lipid Panel    Component Value Date/Time   CHOL 164 12/23/2016 1050   TRIG 93 12/23/2016 1050   HDL 33 (L) 12/23/2016 1050   CHOLHDL 5.0 12/23/2016 1050   CHOLHDL 6.0 03/04/2015 1843   VLDL 35 03/04/2015 1843   LDLCALC 112 (H) 12/23/2016 1050    Physical Exam:    VS:  BP (!) 144/78   Pulse 81   Ht 5' 10"$  (1.778 m)   Wt 297 lb (134.7 kg)   SpO2 98%   BMI 42.62 kg/m     Wt Readings from Last 3 Encounters:  08/17/22 297 lb (134.7 kg)  08/03/22 297 lb 9.9 oz (135 kg)  09/29/21 299 lb (135.6 kg)     GEN:  Well nourished, well developed in no acute distress.  Obese CARDIAC: Irregular rhythm, no murmurs, rubs, gallops RESPIRATORY:  Clear to auscultation without rales, wheezing or rhonchi       ASSESSMENT:    1. Ventricular premature depolarization   2. Frequent PVCs   3. Essential hypertension    PLAN:    In order of problems listed above:   #NSVT/PVCs Continue diltiazem once daily Add K-Dur 20 mEq once daily Add magnesium oxide 400 mg mouth  once daily He is not currently a candidate for invasive EP procedures given his body weight.  #Hypertension Slightly above goal today.  Recommend checking blood pressures 1-2 times per week at home and recording the values.  Recommend bringing these recordings to the primary care physician. Today I really stressed  the importance of taking blood pressure medications regularly.  We discussed the stroke risk associated with blood pressures greater than 200 mmHg.    Follow-up in 6 months with APP   Medication Adjustments/Labs and Tests Ordered: Current medicines are reviewed at length with the patient today.  Concerns regarding medicines are outlined above.  Orders Placed This Encounter  Procedures   EKG 12-Lead   Meds ordered this encounter  Medications   magnesium oxide (MAG-OX) 400 MG tablet    Sig: Take 1 tablet (400 mg total) by mouth daily.    Dispense:  90 tablet    Refill:  3   diltiazem (CARDIZEM CD) 120 MG 24 hr capsule    Sig: Take 2 capsules (240 mg total) by mouth daily.    Dispense:  180 capsule    Refill:  3   potassium chloride SA (KLOR-CON M) 20 MEQ tablet    Sig: Take 1 tablet (20 mEq total) by mouth daily.    Dispense:  90 tablet    Refill:  3     Signed, Lars Mage, MD, [facility], [facility] 08/17/2022 4:49 PM    Electrophysiology [facility]

## 2023-03-21 ENCOUNTER — Other Ambulatory Visit: Payer: Self-pay | Admitting: Cardiovascular Disease

## 2023-03-22 NOTE — Telephone Encounter (Signed)
Good Morning,  Could you schedule this patient an appointment? This patient is a patient of Dr. Kirke Corin and was last seen by Flavia Shipper on 08-10-21. Thank you so much.

## 2023-04-13 ENCOUNTER — Encounter: Payer: Self-pay | Admitting: General Practice

## 2023-05-31 ENCOUNTER — Other Ambulatory Visit: Payer: Self-pay | Admitting: Cardiovascular Disease

## 2023-06-08 ENCOUNTER — Ambulatory Visit
Admission: EM | Admit: 2023-06-08 | Discharge: 2023-06-08 | Disposition: A | Discharge status: Home / Self Care | Payer: BLUE CROSS/BLUE SHIELD | Attending: Emergency Medicine | Admitting: Emergency Medicine

## 2023-06-08 DIAGNOSIS — I10 Essential (primary) hypertension: Secondary | ICD-10-CM | POA: Diagnosis not present

## 2023-06-08 DIAGNOSIS — J069 Acute upper respiratory infection, unspecified: Secondary | ICD-10-CM | POA: Diagnosis not present

## 2023-06-08 LAB — POC COVID19/FLU A&B COMBO
Covid Antigen, POC: NEGATIVE
Influenza A Antigen, POC: NEGATIVE
Influenza B Antigen, POC: NEGATIVE

## 2023-06-08 MED ORDER — BENZONATATE 100 MG PO CAPS
100.0000 mg | ORAL_CAPSULE | Freq: Three times a day (TID) | ORAL | 0 refills | Status: AC | PRN
Start: 2023-06-08 — End: ?

## 2023-06-08 NOTE — ED Triage Notes (Signed)
Patient presents to UC for cough, runny nose, HA, and body aches since yesterday. States he has not taken anything for his symptoms.

## 2023-06-08 NOTE — Discharge Instructions (Addendum)
The COVID and flu tests are negative.   Take Tylenol or ibuprofen as needed for fever or discomfort.  Take plain Mucinex as needed for congestion.  Rest and keep yourself hydrated.    Follow-up with your primary care provider if your symptoms are not improving.    Your blood pressure is elevated today at 143/99; repeat 131/87.  Please have this rechecked by your primary care provider in 2-4 weeks.

## 2023-06-08 NOTE — ED Provider Notes (Signed)
Renaldo Fiddler    CSN: [id number] Arrival date & time: 06/08/23  8413      History   Chief Complaint Chief Complaint  Patient presents with   Cough   Nasal Congestion   Headache    HPI Seth Parker is a 37 y.o. male.  Patient presents with 1 day history of runny nose, cough, headache, body aches.  No OTC medications taken.  No fever, shortness of breath, vomiting, diarrhea, or other symptoms.  His medical history includes hypertension and PVCs; he is followed by cardiology; last seen on 08/17/2022.  The history is provided by the patient and medical records.    Past Medical History:  Diagnosis Date   Anxiety    Chest pain    a. 08/2014 ETT: Ex time 6:40. Max HR 169. no ST/T changes; b. 09/2019 Cor CTA: LM nl, LAD nl, LCX nl, RCA nl. Cor Ca2+ = 0.   Depression    Family history of premature CAD    a. in father with CAD s/p CABG in early 80s   GERD (gastroesophageal reflux disease)    History of echocardiogram    a. 09/2019 Echo: EF 55-60%, no rwma, mild LVH; b. 07/2020 Echo: EF 60-65%, no rwma, nl RV fxn; c. 09/2021 Echo: EF 60-65%, no rwma, nl RV fxn.   Hyperlipidemia    Hypertension    Lumbar spinal stenosis    herniated discs   Morbid obesity (HCC)    Pituitary adenoma (HCC)    PVC's (premature ventricular contractions)    a. 05/2020 Zio: RSR, 79, 17 short runs of NSVT (longest 5 beats), 32% PVC burden.   Sleep apnea    uses cpap some nights set oon 7    Spinal stenosis    Tobacco abuse    a. 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 Authorizing Provider  benzonatate (TESSALON) 100 MG capsule Take 1 capsule (100 mg total) by mouth 3 (three) times daily as needed for cough. 06/08/23  Yes Mickie Bail, NP  cetirizine (ZYRTEC) 10 MG tablet Take 10 mg by mouth daily.    [provider]  diltiazem (CARDIZEM CD) 120 MG 24 hr capsule Take 2 capsules (240 mg total) by mouth daily. 08/17/22   Lanier Prude, MD  hydrochlorothiazide (HYDRODIURIL) 25 MG tablet TAKE 1 TABLET (25 MG TOTAL) BY MOUTH DAILY. 05/31/23   Iran Ouch, MD  LAGEVRIO 200 MG CAPS capsule Take 4 capsules by mouth 2 (two) times daily. 08/15/22   [provider]  magnesium oxide (MAG-OX) 400 MG tablet Take 1 tablet (400 mg total) by mouth daily. 08/17/22   Lanier Prude, MD  potassium chloride SA (KLOR-CON M) 20 MEQ tablet Take 1 tablet (20  mEq total) by mouth daily. 08/17/22   Lanier Prude, MD    Family History Family History  Problem Relation Age of Onset   Heart disease Father    Heart attack Father 42   Hypertension Father    Hyperlipidemia Father    Mental illness Father        bipolar   Hypertension Mother    Cancer Maternal Aunt        breast   Cancer Maternal Uncle 56       colon   Cancer Maternal Grandmother     Social History Social History   Tobacco Use   Smoking status: Never   Smokeless tobacco: Current    Types: Chew   Tobacco comments:    07/08/2020 "I have never smoked."  Vaping Use   Vaping status: Never Used  Substance Use Topics   Alcohol use: Not Currently   Drug use: No     Allergies   Patient has no known allergies.   Review of Systems Review of Systems  Constitutional:   Negative for chills and fever.  HENT:  Positive for rhinorrhea. Negative for ear pain and sore throat.   Respiratory:  Positive for cough. Negative for shortness of breath.   Cardiovascular:  Negative for chest pain and palpitations.  Gastrointestinal:  Negative for diarrhea and vomiting.  Neurological:  Positive for headaches.     Physical Exam Triage Vital Signs ED Triage Vitals  Encounter Vitals Group     BP      Systolic BP Percentile      Diastolic BP Percentile      Pulse      Resp      Temp      Temp src      SpO2      Weight      Height      Head Circumference      Peak Flow      Pain Score      Pain Loc      Pain Education      Exclude from Growth Chart    No data found.  Updated Vital Signs BP 131/87 (BP Location: Left Arm)   Pulse 70   Temp 98 F (36.7 C) (Temporal)   Resp 16   SpO2 96%   Visual Acuity Right Eye Distance:   Left Eye Distance:   Bilateral Distance:    Right Eye Near:   Left Eye Near:    Bilateral Near:     Physical Exam Constitutional:      General: He is not in acute distress.    Appearance: He is obese.  HENT:     Right Ear: Tympanic membrane normal.     Left Ear: Tympanic membrane normal.     Nose: Nose normal.     Mouth/Throat:     Mouth: Mucous membranes are moist.     Pharynx: Oropharynx is clear.  Cardiovascular:     Rate and Rhythm: Normal rate and regular rhythm.     Heart sounds: Normal heart sounds.  Pulmonary:     Effort: Pulmonary effort is normal. No respiratory distress.     Breath sounds: Normal breath sounds.  Skin:    General: Skin is warm and dry.  Neurological:     Mental Status: He is alert.      UC Treatments / Results  Labs (all labs ordered are listed, but only abnormal results are displayed) Labs Reviewed  POC COVID19/FLU A&B  COMBO    EKG   Radiology No results found.  Procedures Procedures (including critical care time)  Medications Ordered in UC Medications - No data to  display  Initial Impression / Assessment and Plan / UC Course  I have reviewed the triage vital signs and the nursing notes.  Pertinent labs & imaging results that were available during my care of the patient were reviewed by me and considered in my medical decision making (see chart for details).    Viral URI, elevated blood pressure reading with hypertension.  Rapid COVID and flu negative.  Discussed symptomatic treatment including Tessalon Perles, Tylenol or ibuprofen as needed for fever or discomfort, plain Mucinex as needed for congestion, rest, hydration.  Instructed patient to follow-up with PCP if not improving.  ED precautions given.  Also discussed with patient that his blood pressure is elevated today and needs to be rechecked by PCP in 2 to 4 weeks.  Education provided on managing hypertension.  Patient agrees to plan of care.   Final Clinical Impressions(s) / UC Diagnoses   Final diagnoses:  Viral URI  Elevated blood pressure reading in office with diagnosis of hypertension     Discharge Instructions      The COVID and flu tests are negative.   Take Tylenol or ibuprofen as needed for fever or discomfort.  Take plain Mucinex as needed for congestion.  Rest and keep yourself hydrated.    Follow-up with your primary care provider if your symptoms are not improving.    Your blood pressure is elevated today at 143/99; repeat 131/87.  Please have this rechecked by your primary care provider in 2-4 weeks.          ED Prescriptions     Medication Sig Dispense Auth. Provider   benzonatate (TESSALON) 100 MG capsule Take 1 capsule (100 mg total) by mouth 3 (three) times daily as needed for cough. 21 capsule Mickie Bail, NP      PDMP not reviewed this encounter.   Mickie Bail, NP 06/08/23 [phone]

## 2023-06-11 ENCOUNTER — Ambulatory Visit: Payer: BLUE CROSS/BLUE SHIELD

## 2023-06-11 ENCOUNTER — Ambulatory Visit
Admission: EM | Admit: 2023-06-11 | Discharge: 2023-06-11 | Disposition: A | Discharge status: Home / Self Care | Payer: BLUE CROSS/BLUE SHIELD | Attending: Physician Assistant | Admitting: Physician Assistant

## 2023-06-11 ENCOUNTER — Encounter: Payer: Self-pay | Admitting: Emergency Medicine

## 2023-06-11 DIAGNOSIS — J069 Acute upper respiratory infection, unspecified: Secondary | ICD-10-CM | POA: Diagnosis not present

## 2023-06-11 DIAGNOSIS — I10 Essential (primary) hypertension: Secondary | ICD-10-CM

## 2023-06-11 DIAGNOSIS — R5383 Other fatigue: Secondary | ICD-10-CM

## 2023-06-11 MED ORDER — PROMETHAZINE-DM 6.25-15 MG/5ML PO SYRP: 5.0000 mL | ORAL_SOLUTION | Freq: Four times a day (QID) | ORAL | 0 refills | Status: AC | PRN

## 2023-06-11 NOTE — ED Provider Notes (Signed)
[facility]    CSN: [id number] Arrival date & time: 06/11/23  6440      History   Chief Complaint Chief Complaint  Patient presents with   Cough    HPI Seth Parker is a 37 y.o. male presenting for cough, congestion, fatigue and chills x 4 days. Patient seen and evaluated urgent care 3 days ago at onset of symptoms.  Had negative flu and COVID testing at that time.  Patient was given benzonatate for cough.  Returns today not feeling any better.  Denies fever, sore throat, sinus pain, chest pain, wheezing or shortness of breath.  No vomiting or diarrhea.  Taking OTC Mucinex without relief. His 2 young children are sick as well and being evaluated with him today.  Medical history significant for hypertension, hyperlipidemia, obesity, tobacco abuse, anxiety, GERD, PVCs and chronic back pain.  HPI  Past Medical History:  Diagnosis Date   Anxiety    Chest pain    a. 08/2014 ETT: Ex time 6:40. Max HR 169. no ST/T changes; b. 09/2019 Cor CTA: LM nl, LAD nl, LCX nl, RCA nl. Cor Ca2+ = 0.   Depression    Family history of premature CAD    a. in father with CAD s/p CABG in early 85s   GERD (gastroesophageal reflux disease)    History of echocardiogram    a. 09/2019 Echo: EF 55-60%, no rwma, mild LVH; b. 07/2020 Echo: EF 60-65%, no rwma, nl RV fxn; c. 09/2021 Echo: EF 60-65%, no rwma, nl RV fxn.   Hyperlipidemia    Hypertension    Lumbar spinal stenosis    herniated discs   Morbid obesity (HCC)    Pituitary adenoma (HCC)    PVC's (premature ventricular contractions)    a. 05/2020 Zio: RSR, 79, 17 short runs of NSVT (longest 5 beats), 32% PVC burden.   Sleep apnea    uses cpap some nights set oon 7    Spinal stenosis    Tobacco abuse    a. 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 Authorizing Provider  diltiazem (CARDIZEM CD) 120 MG 24 hr capsule Take 2 capsules (240 mg total) by mouth daily. 08/17/22  Yes Lanier Prude, MD  hydrochlorothiazide (HYDRODIURIL) 25 MG tablet TAKE 1 TABLET (25 MG TOTAL) BY MOUTH DAILY. 05/31/23  Yes Iran Ouch, MD  potassium chloride SA (KLOR-CON M) 20 MEQ tablet Take 1 tablet (20 mEq total) by mouth daily. 08/17/22  Yes Lanier Prude, MD  promethazine-dextromethorphan (PROMETHAZINE-DM) 6.25-15 MG/5ML syrup Take 5 mLs by mouth 4 (four) times daily as needed. 06/11/23  Yes Shirlee Latch, PA-C  benzonatate (TESSALON) 100 MG capsule Take 1 capsule (100 mg total) by mouth 3 (three) times  daily as needed for cough. 06/08/23   Mickie Bail, NP  cetirizine (ZYRTEC) 10 MG tablet Take 10 mg by mouth daily.    [provider]  LAGEVRIO 200 MG CAPS capsule Take 4 capsules by mouth 2 (two) times daily. 08/15/22   [provider]  magnesium oxide (MAG-OX) 400 MG tablet Take 1 tablet (400 mg total) by mouth daily. 08/17/22   Lanier Prude, MD    Family History Family History  Problem Relation Age of Onset   Heart disease Father    Heart attack Father 82   Hypertension Father    Hyperlipidemia Father    Mental illness Father        bipolar   Hypertension Mother    Cancer Maternal Aunt        breast   Cancer Maternal Uncle 74       colon   Cancer Maternal Grandmother     Social  History Social History   Tobacco Use   Smoking status: Never   Smokeless tobacco: Current    Types: Chew   Tobacco comments:    07/08/2020 "I have never smoked."  Vaping Use   Vaping status: Never Used  Substance Use Topics   Alcohol use: Not Currently   Drug use: No     Allergies   Patient has no known allergies.   Review of Systems Review of Systems  Constitutional:  Positive for chills and fatigue. Negative for fever.  HENT:  Positive for congestion and rhinorrhea. Negative for sinus pressure, sinus pain and sore throat.   Respiratory:  Positive for cough. Negative for shortness of breath.   Gastrointestinal:  Negative for abdominal pain, diarrhea, nausea and vomiting.  Musculoskeletal:  Negative for myalgias.  Neurological:  Negative for weakness, light-headedness and headaches.  Hematological:  Negative for adenopathy.     Physical Exam Triage Vital Signs ED Triage Vitals  Encounter Vitals Group     BP      Systolic BP Percentile      Diastolic BP Percentile      Pulse      Resp      Temp      Temp src      SpO2      Weight      Height      Head Circumference      Peak Flow      Pain Score      Pain Loc      Pain Education      Exclude from Growth Chart    No data found.  Updated Vital Signs BP (!) 154/115 (BP Location: Right Leg)   Pulse 89   Temp 98.8 F (37.1 C) (Oral)   Resp 16   Ht 5\' 10"  (1.778 m)   Wt 296 lb 15.4 oz (134.7 kg)   SpO2 99%   BMI 42.61 kg/m      Physical Exam Vitals and nursing note reviewed.  Constitutional:      General: He is not in acute distress.    Appearance: Normal appearance. He is well-developed. He is not ill-appearing.  HENT:     Head: Normocephalic and atraumatic.     Nose: Congestion present.     Mouth/Throat:     Mouth: Mucous membranes are moist.     Pharynx: Oropharynx is clear.  Eyes:     General: No scleral icterus.    Conjunctiva/sclera: Conjunctivae normal.  Cardiovascular:     Rate and  Rhythm: Normal rate and regular rhythm.     Heart sounds: Normal heart sounds.  Pulmonary:     Effort: Pulmonary effort is normal. No respiratory distress.     Breath sounds: Normal breath sounds.  Musculoskeletal:     Cervical back: Neck supple.  Skin:    General: Skin is warm and dry.     Capillary Refill: Capillary refill takes less than 2 seconds.  Neurological:     General: No focal deficit present.     Mental Status: He is alert. Mental status is at baseline.     Motor: No weakness.     Gait: Gait normal.  Psychiatric:        Mood and Affect: Mood normal.        Behavior: Behavior normal.      UC Treatments / Results  Labs (all labs ordered are listed, but only abnormal results are displayed) Labs Reviewed - No data to display  EKG   Radiology DG Chest 2 View  Result Date: 06/11/2023 CLINICAL DATA:  Cough congestion and chills for 3 days. EXAM: CHEST - 2 VIEW COMPARISON:  08/03/2022 FINDINGS: Lateral view is mildly motion degraded. Midline trachea. Normal heart size and mediastinal contours. No pleural effusion or pneumothorax. Clear lungs. IMPRESSION: No active cardiopulmonary disease. Electronically Signed   By: Jeronimo Greaves M.D.   On: 06/11/2023 10:49    Procedures Procedures (including critical care time)  Medications Ordered in UC Medications - No data to display  Initial Impression / Assessment and Plan / UC Course  I have reviewed the triage vital signs and the nursing notes.  Pertinent labs & imaging results that were available during my care of the patient were reviewed by me and considered in my medical decision making (see chart for details).   37 year old male presents for 4-day history of cough, congestion, chills and fatigue.  Seen here 3 days ago and negative for flu and COVID.  Denies any improvement in symptoms.  No fever, sinus pain, sore throat, chest pain, wheezing or shortness of breath.  His 2 young children are sick as well.  Patient  afebrile.  Overall well-appearing.  No acute distress.  On exam has mild nasal congestion.  Throat clear.  Chest clear auscultation heart regular rate and rhythm.  BP elevated 154/115.  Advised to continue diltiazem, HCTZ and keep a log of blood pressures.  To follow-up with PCP if continued elevated blood pressures greater than 140/90.  Chronic issue.  Chest x-ray obtained given continued complaints.  Will assess for possible pneumonia.  Chest x-ray is negative.  Symptoms consistent with acute viral illness with systemic symptoms.  Advised increased rest and fluids.  Explained that viruses can last for couple weeks sometimes but he should return if he develops a fever, worsening cough or increased weakness or difficulty breathing.  Sent Promethazine DM to pharmacy.   Final Clinical Impressions(s) / UC Diagnoses   Final diagnoses:  Viral URI with cough  Other fatigue  Essential hypertension     Discharge Instructions      Chest was normal.  You do not have pneumonia.  Antibiotics will not be helpful.  This may take a week or 2, sometimes longer to resolve.  Return if you have a fever or trouble breathing.  URI/COLD SYMPTOMS: Your exam today is consistent with a viral illness. Antibiotics are not indicated at this time. Use medications as directed, including cough syrup, nasal saline, and decongestants. Your symptoms should improve over the  next few days and resolve within 7-10 days. Increase rest and fluids. F/u if symptoms worsen or predominate such as sore throat, ear pain, productive cough, shortness of breath, or if you develop high fevers or worsening fatigue over the next several days.    -BP elevated 154/115.  Advised to continue diltiazem, HCTZ and keep a log of blood pressures.  To follow-up with PCP if continued elevated blood pressures greater than 140/90.     ED Prescriptions     Medication Sig Dispense Auth. Provider   promethazine-dextromethorphan (PROMETHAZINE-DM)  6.25-15 MG/5ML syrup Take 5 mLs by mouth 4 (four) times daily as needed. 118 mL Shirlee Latch, PA-C      PDMP not reviewed this encounter.   Shirlee Latch, PA-C 06/11/23 1108

## 2023-06-11 NOTE — ED Triage Notes (Signed)
Patient c/o cough, chest congestion and chills that started on Wed night.  Patient denies fevers.

## 2023-06-11 NOTE — Discharge Instructions (Addendum)
Chest was normal.  You do not have pneumonia.  Antibiotics will not be helpful.  This may take a week or 2, sometimes longer to resolve.  Return if you have a fever or trouble breathing.  URI/COLD SYMPTOMS: Your exam today is consistent with a viral illness. Antibiotics are not indicated at this time. Use medications as directed, including cough syrup, nasal saline, and decongestants. Your symptoms should improve over the next few days and resolve within 7-10 days. Increase rest and fluids. F/u if symptoms worsen or predominate such as sore throat, ear pain, productive cough, shortness of breath, or if you develop high fevers or worsening fatigue over the next several days.    -BP elevated 154/115.  Advised to continue diltiazem, HCTZ and keep a log of blood pressures.  To follow-up with PCP if continued elevated blood pressures greater than 140/90.

## 2023-09-13 ENCOUNTER — Other Ambulatory Visit: Payer: Self-pay | Admitting: Cardiovascular Disease

## 2023-09-24 ENCOUNTER — Other Ambulatory Visit: Payer: Self-pay | Admitting: Cardiology

## 2023-09-24 DIAGNOSIS — I493 Ventricular premature depolarization: Secondary | ICD-10-CM

## 2023-10-09 ENCOUNTER — Emergency Department

## 2023-10-09 ENCOUNTER — Emergency Department
Admission: EM | Admit: 2023-10-09 | Discharge: 2023-10-09 | Disposition: A | Discharge status: Home / Self Care | Attending: Emergency Medicine | Admitting: Emergency Medicine

## 2023-10-09 ENCOUNTER — Other Ambulatory Visit: Payer: Self-pay

## 2023-10-09 ENCOUNTER — Encounter: Payer: Self-pay | Admitting: *Deleted

## 2023-10-09 DIAGNOSIS — N5082 Scrotal pain: Secondary | ICD-10-CM

## 2023-10-09 DIAGNOSIS — I10 Essential (primary) hypertension: Secondary | ICD-10-CM | POA: Diagnosis not present

## 2023-10-09 DIAGNOSIS — N433 Hydrocele, unspecified: Secondary | ICD-10-CM | POA: Insufficient documentation

## 2023-10-09 LAB — URINALYSIS, ROUTINE W REFLEX MICROSCOPIC
Bilirubin Urine: NEGATIVE
Glucose, UA: NEGATIVE mg/dL
Hgb urine dipstick: NEGATIVE
Ketones, ur: NEGATIVE mg/dL
Leukocytes,Ua: NEGATIVE
Nitrite: NEGATIVE
Protein, ur: NEGATIVE mg/dL
Specific Gravity, Urine: 1.023 (ref 1.005–1.030)
pH: 5 (ref 5.0–8.0)

## 2023-10-09 LAB — CHLAMYDIA/NGC RT PCR (ARMC ONLY)
Chlamydia Tr: NOT DETECTED
N gonorrhoeae: NOT DETECTED

## 2023-10-09 MED ORDER — BACITRACIN ZINC 500 UNIT/GM EX OINT
TOPICAL_OINTMENT | Freq: Once | CUTANEOUS | Status: DC
Start: 2023-10-09 — End: 2023-10-09
  Filled 2023-10-09: qty 0.9

## 2023-10-09 MED ORDER — CIPROFLOXACIN HCL 500 MG PO TABS: 500.0000 mg | ORAL_TABLET | Freq: Two times a day (BID) | ORAL | 0 refills | Status: AC

## 2023-10-09 MED ORDER — CIPROFLOXACIN HCL 500 MG PO TABS
500.0000 mg | ORAL_TABLET | Freq: Once | ORAL | Status: AC
  Administered 2023-10-09: 500 mg via ORAL
  Filled 2023-10-09: qty 1

## 2023-10-09 NOTE — Discharge Instructions (Signed)
 Your ultrasound shows hydrocele; based on your symptoms, this could be caused by infection.  Take the antibiotic as prescribed and finish the full course.  Follow up with urology.  Return to the ER for new, worsening or persistent severe testicular pain, swelling, inability to pass urine, blood in the urine, abdominal pain, fever or any other new or worsening symptoms that concern you.

## 2023-10-09 NOTE — ED Provider Notes (Signed)
 [facility] Provider Note    Event Date/Time   First MD Initiated Contact with Patient 10/09/23 2200     (approximate)   History   Testicle Pain   HPI  Seth Parker is a 38 y.o. male with history of hypertension, hyperlipidemia, obesity, and GERD who presents with scrotal pain over the last 3 days, bilateral, gradual onset, feeling almost like a string is wrapped around both testicles and pulling upwards.  He states that the discomfort is radiating into the lower abdomen although he has no acute abdominal pain.  He also reports urinary hesitancy and some dysuria although no hematuria.  He denies any penile discharge.  He has no fever or chills.  Denies any vomiting or diarrhea.  I reviewed the past medical records.  The patient's most recent prior encounter was with urgent care at [facility] on 1/30 for flulike symptoms.   Physical Exam   Triage Vital Signs: ED Triage Vitals  Encounter Vitals Group     BP 10/09/23 1938 (!) 148/83     Systolic BP Percentile --      Diastolic BP Percentile --      Pulse Rate 10/09/23 1938 76     Resp 10/09/23 1938 18     Temp 10/09/23 1938 98.3 F (36.8 C)     Temp Source 10/09/23 1938 Oral     SpO2 10/09/23 1938 96 %     Weight 10/09/23 1936 280 lb (127 kg)     Height 10/09/23 1936 5\' 10"  (1.778 m)     Head Circumference --      Peak Flow --      Pain Score 10/09/23 1936 3     Pain Loc --      Pain Education --      Exclude from Growth Chart --     Most recent vital signs: Vitals:   10/09/23 1938 10/09/23 2333  BP: (!) 148/83 (!) 140/92  Pulse: 76 72  Resp: 18 18  Temp: 98.3 F (36.8 C)   SpO2: 96% 98%     General: Awake, no distress.  CV:  Good peripheral perfusion.  Resp:  Normal effort.  Abd:  Soft and nontender.  No distention.  Other:  No significant scrotal swelling.  No testicular tenderness.  No discharge at the penile meatus.  No inguinal lymphadenopathy.   ED Results / Procedures /  Treatments   Labs (all labs ordered are listed, but only abnormal results are displayed) Labs Reviewed  URINALYSIS, ROUTINE W REFLEX MICROSCOPIC - Abnormal; Notable for the following components:      Result Value   Color, Urine YELLOW (*)    APPearance HAZY (*)    All other components within normal limits  CHLAMYDIA/NGC RT PCR ([facility] ONLY)               EKG     RADIOLOGY  US scrotum:  IMPRESSION:  1. Negative for testicular torsion or mass.  2. Small bilateral hydroceles containing particulate debris.  3. Bilateral epididymal cysts.     PROCEDURES:  Critical Care performed: No  Procedures   MEDICATIONS ORDERED IN ED: Medications  ciprofloxacin (CIPRO) tablet 500 mg (500 mg Oral Given 10/09/23 2332)     IMPRESSION / MDM / ASSESSMENT AND PLAN / ED COURSE  I reviewed the triage vital signs and the nursing notes.  Differential diagnosis includes, but is not limited to, epididymitis, orchitis, hydrocele, varicocele, UTI, cystitis, less likely hernia.  I do not suspect ovarian torsion.  Urinalysis shows no acute findings.  GC/CT is negative.  Ultrasound is pending.  Patient's presentation is most consistent with acute complicated illness / injury requiring diagnostic workup.  ----------------------------------------- 11:48 PM on 10/09/2023 -----------------------------------------  Ultrasound shows hydrocele with some debris but no other acute findings.  Although his urinalysis is negative, given the urinary symptoms and this ultrasounding finding, infectious process seems to be the most likely.  Based on shared decision making with the patient I think he will benefit from empiric antibiotics for 10 days to treat for possible epididymoorchitis.  I recommend that he follow-up with urology as well.  At this time, the patient is stable for discharge.  He gave strict return precautions and he expressed understanding.  The patient has no high risk sexual history I have  prescribed a 10-day course of Cipro.  I have given urology referral.   FINAL CLINICAL IMPRESSION(S) / ED DIAGNOSES   Final diagnoses:  Scrotal pain  Hydrocele, unspecified hydrocele type     Rx / DC Orders   ED Discharge Orders          Ordered    ciprofloxacin (CIPRO) 500 MG tablet  2 times daily        10/09/23 2322             Note:  This document was prepared using Dragon voice recognition software and may include unintentional dictation errors.    Dionne Bucy, MD 10/09/23 [id number]

## 2023-10-09 NOTE — ED Triage Notes (Signed)
 Pt ambulatory to triage.  Pt reports diff urinating and pain in both testicles.  Left hurts worse than right.  No swelling to scrotum.  Pt alert.  Sx for 3 days, worse today.  Pt has nausea.  No vomiting.

## 2023-10-18 ENCOUNTER — Other Ambulatory Visit: Payer: Self-pay | Admitting: Cardiology

## 2023-10-18 DIAGNOSIS — I493 Ventricular premature depolarization: Secondary | ICD-10-CM

## 2023-10-22 ENCOUNTER — Other Ambulatory Visit: Payer: Self-pay | Admitting: Cardiology

## 2023-10-22 DIAGNOSIS — I493 Ventricular premature depolarization: Secondary | ICD-10-CM

## 2023-11-03 ENCOUNTER — Other Ambulatory Visit: Payer: Self-pay | Admitting: Cardiology

## 2023-11-30 ENCOUNTER — Encounter: Payer: Self-pay | Admitting: Cardiology

## 2023-12-21 ENCOUNTER — Other Ambulatory Visit: Payer: Self-pay | Admitting: Nurse Practitioner

## 2024-02-01 ENCOUNTER — Emergency Department

## 2024-02-01 ENCOUNTER — Emergency Department
Admission: EM | Admit: 2024-02-01 | Discharge: 2024-02-01 | Disposition: A | Discharge status: Home / Self Care | Attending: Emergency Medicine | Admitting: Emergency Medicine

## 2024-02-01 ENCOUNTER — Other Ambulatory Visit: Payer: Self-pay

## 2024-02-01 DIAGNOSIS — I10 Essential (primary) hypertension: Secondary | ICD-10-CM | POA: Insufficient documentation

## 2024-02-01 DIAGNOSIS — R079 Chest pain, unspecified: Secondary | ICD-10-CM | POA: Diagnosis present

## 2024-02-01 LAB — CBC
HCT: 44.2 % (ref 39.0–52.0)
Hemoglobin: 14.6 g/dL (ref 13.0–17.0)
MCH: 29.2 pg (ref 26.0–34.0)
MCHC: 33 g/dL (ref 30.0–36.0)
MCV: 88.4 fL (ref 80.0–100.0)
Platelets: 227 K/uL (ref 150–400)
RBC: 5 MIL/uL (ref 4.22–5.81)
RDW: 12.2 % (ref 11.5–15.5)
WBC: 7.9 K/uL (ref 4.0–10.5)
nRBC: 0 % (ref 0.0–0.2)

## 2024-02-01 LAB — BASIC METABOLIC PANEL WITH GFR
Anion gap: 9 (ref 5–15)
BUN: 13 mg/dL (ref 6–20)
CO2: 25 mmol/L (ref 22–32)
Calcium: 9.1 mg/dL (ref 8.9–10.3)
Chloride: 105 mmol/L (ref 98–111)
Creatinine, Ser: 0.94 mg/dL (ref 0.61–1.24)
GFR, Estimated: >60 mL/min (ref >60)
Glucose, Bld: 96 mg/dL (ref 70–99)
Potassium: 3.5 mmol/L (ref 3.5–5.1)
Sodium: 139 mmol/L (ref 135–145)

## 2024-02-01 LAB — TROPONIN I (HIGH SENSITIVITY)
Troponin I (High Sensitivity): 6 ng/L (ref <18)
Troponin I (High Sensitivity): 6 ng/L (ref <18)

## 2024-02-01 MED ORDER — HYDROXYZINE HCL 25 MG PO TABS: 25.0000 mg | ORAL_TABLET | Freq: Every day | ORAL | 0 refills | Status: AC | PRN

## 2024-02-01 MED ORDER — HYDROXYZINE HCL 25 MG PO TABS
25.0000 mg | ORAL_TABLET | Freq: Once | ORAL | Status: AC
  Administered 2024-02-01: 25 mg via ORAL
  Filled 2024-02-01: qty 1

## 2024-02-01 MED ORDER — SODIUM CHLORIDE 0.9 % IV BOLUS
1000.0000 mL | Freq: Once | INTRAVENOUS | Status: AC
  Administered 2024-02-01: 1000 mL via INTRAVENOUS

## 2024-02-01 NOTE — ED Provider Notes (Signed)
 [facility] Provider Note    Event Date/Time   First MD Initiated Contact with Patient 02/01/24 1821     (approximate)  History   Chief Complaint: Chest Pain  HPI  Seth Parker is a 38 y.o. male with a past medical history of anxiety, gastric reflux, hypertension, hyperlipidemia, PVCs on diltiazem , presents to the emergency department for chest pain.  According to the patient while at work today states he was on a roof moving a box and felt a pain in the chest.  Patient describes it as a tightness or squeezing type pain lasted approximately 5 seconds and then resolved.  Patient states later on in the day it occurred once again, lasting again about 5 seconds and then resolving.  Patient was worried so he came to the emergency department for evaluation.    Physical Exam   Triage Vital Signs: ED Triage Vitals  Encounter Vitals Group     BP 02/01/24 1743 (!) 158/103     Girls Systolic BP Percentile --      Girls Diastolic BP Percentile --      Boys Systolic BP Percentile --      Boys Diastolic BP Percentile --      Pulse Rate 02/01/24 1743 72     Resp 02/01/24 1743 16     Temp 02/01/24 1743 98 F (36.7 C)     Temp Source 02/01/24 1743 Oral     SpO2 02/01/24 1743 100 %     Weight 02/01/24 1748 280 lb (127 kg)     Height 02/01/24 1748 5' 10 (1.778 m)     Head Circumference --      Peak Flow --      Pain Score 02/01/24 1748 0     Pain Loc --      Pain Education --      Exclude from Growth Chart --     Most recent vital signs: Vitals:   02/01/24 1743  BP: (!) 158/103  Pulse: 72  Resp: 16  Temp: 98 F (36.7 C)  SpO2: 100%    General: Awake, no distress.  CV:  Good peripheral perfusion.  Regular rate and rhythm  Resp:  Normal effort.  Equal breath sounds bilaterally.  Abd:  No distention.  Soft, nontender.  No rebound or guarding.  ED Results / Procedures / Treatments   EKG  EKG viewed and interpreted by myself shows a normal sinus  rhythm at 78 bpm with a narrow QRS, normal axis, normal intervals, no concerning ST changes.  RADIOLOGY  I have reviewed and interpreted chest x-ray images.  No consolidation on my evaluation. Radiology is read the x-ray is negative   MEDICATIONS ORDERED IN ED: Medications  sodium chloride  0.9 % bolus 1,000 mL (has no administration in time range)  hydrOXYzine  (ATARAX ) tablet 25 mg (has no administration in time range)     IMPRESSION / MDM / ASSESSMENT AND PLAN / ED COURSE  I reviewed the triage vital signs and the nursing notes.  Patient's presentation is most consistent with acute presentation with potential threat to life or bodily function.  Patient presents to the emergency department for chest pain.  Overall patient appears well.  EKG does show several ectopic beats however the patient states a history of ectopic beats/palpitations at baseline takes diltiazem  for this.  Patient's chest pain both events were very brief approximately 5 seconds.  Denies any symptoms currently.  Will check labs including cardiac enzymes x  2.  We will IV hydrate as the patient states he may have gotten dehydrated today.  We will continue to closely monitor him.  Patient states he is most worried because his younger brother who is 2 years younger recently had a heart attack and his father had a heart attack at around a young age as well.  If the patient's workup was negative including heart enzymes x 2 we will have the patient follow-up with cardiology for further workup and testing such as a stress test if deemed necessary.  Patient agreeable to plan.  Patient's lab work is reassuring troponin is negative x 2 reassuring CBC reassuring chemistry.  Chest x-ray is clear and EKG shows no concerning changes.  Patient follows up with Dr. Liberty, states he will call and make a follow-up appointment.  I discussed my typical chest pain return precautions.  Patient would like to be discharged with a short course of  hydroxyzine  to see if it helps when his symptoms occur which I believe is reasonable until he can see his cardiologist.  FINAL CLINICAL IMPRESSION(S) / ED DIAGNOSES   Chest pain   Note:  This document was prepared using Dragon voice recognition software and may include unintentional dictation errors.   Dorothyann Drivers, MD 02/01/24 2059

## 2024-02-01 NOTE — ED Triage Notes (Addendum)
 C/O mid chest pressure also pressure to mid back. States pain occurs with exertion and resolves with rest.  Also reports SOB with episodes of chest pressure  AAOx3.  Skin warm and dry. NAD

## 2024-05-05 ENCOUNTER — Other Ambulatory Visit: Payer: Self-pay

## 2024-05-05 ENCOUNTER — Emergency Department

## 2024-05-05 ENCOUNTER — Emergency Department
Admission: EM | Admit: 2024-05-05 | Discharge: 2024-05-05 | Disposition: A | Discharge status: Home / Self Care | Attending: Emergency Medicine | Admitting: Emergency Medicine

## 2024-05-05 DIAGNOSIS — X509XXA Other and unspecified overexertion or strenuous movements or postures, initial encounter: Secondary | ICD-10-CM | POA: Diagnosis not present

## 2024-05-05 DIAGNOSIS — S39012A Strain of muscle, fascia and tendon of lower back, initial encounter: Secondary | ICD-10-CM | POA: Insufficient documentation

## 2024-05-05 DIAGNOSIS — M5416 Radiculopathy, lumbar region: Secondary | ICD-10-CM | POA: Insufficient documentation

## 2024-05-05 DIAGNOSIS — I10 Essential (primary) hypertension: Secondary | ICD-10-CM | POA: Insufficient documentation

## 2024-05-05 DIAGNOSIS — M545 Low back pain, unspecified: Secondary | ICD-10-CM | POA: Diagnosis present

## 2024-05-05 MED ORDER — PREDNISONE 20 MG PO TABS
60.0000 mg | ORAL_TABLET | Freq: Once | ORAL | Status: AC
  Administered 2024-05-05: 60 mg via ORAL
  Filled 2024-05-05: qty 3

## 2024-05-05 MED ORDER — TIZANIDINE HCL 2 MG PO TABS
2.0000 mg | ORAL_TABLET | Freq: Three times a day (TID) | ORAL | 0 refills | Status: AC | PRN
Start: 2024-05-05 — End: 2024-05-10

## 2024-05-05 MED ORDER — TIZANIDINE HCL 4 MG PO TABS
2.0000 mg | ORAL_TABLET | Freq: Once | ORAL | Status: AC
  Administered 2024-05-05: 2 mg via ORAL
  Filled 2024-05-05: qty 1

## 2024-05-05 MED ORDER — PREDNISONE 20 MG PO TABS: ORAL_TABLET | ORAL | 0 refills | Status: AC

## 2024-05-05 MED ORDER — KETOROLAC TROMETHAMINE 30 MG/ML IJ SOLN
60.0000 mg | Freq: Once | INTRAMUSCULAR | Status: AC
  Administered 2024-05-05: 60 mg via INTRAMUSCULAR
  Filled 2024-05-05: qty 2

## 2024-05-05 NOTE — ED Provider Notes (Signed)
 [facility] Emergency Department Provider Note     Event Date/Time   First MD Initiated Contact with Patient 05/05/24 1543     (approximate)   History   Back Pain   HPI  Seth Parker is a 38 y.o. male with a history of HTN, HLD, lumbar discectomy, spinal stenosis and DDD, presents to the ED after developing some low back pain with intermittent right lower extremity referral.  Patient notes onset of symptoms yesterday while he was carrying a large heavy piece of furniture.  He denies any bladder or bowel incontinence, foot drop, or saddle anesthesia.  He does endorse some intermittent pain and catching to the right hip and low back pain region.   Physical Exam   Triage Vital Signs: ED Triage Vitals  Encounter Vitals Group     BP 05/05/24 1508 (!) 158/94     Girls Systolic BP Percentile --      Girls Diastolic BP Percentile --      Boys Systolic BP Percentile --      Boys Diastolic BP Percentile --      Pulse Rate 05/05/24 1508 75     Resp 05/05/24 1508 18     Temp 05/05/24 1508 98 F (36.7 C)     Temp src --      SpO2 05/05/24 1508 100 %     Weight 05/05/24 1507 285 lb (129.3 kg)     Height 05/05/24 1507 5' 10 (1.778 m)     Head Circumference --      Peak Flow --      Pain Score 05/05/24 1507 5     Pain Loc --      Pain Education --      Exclude from Growth Chart --     Most recent vital signs: Vitals:   05/05/24 1508 05/05/24 1609  BP: (!) 158/94   Pulse: 75   Resp: 18   Temp: 98 F (36.7 C)   SpO2: 100% 100%    General Awake, no distress. NAD HEENT NCAT. PERRL. EOMI. No rhinorrhea. Mucous membranes are moist.  CV:  Good peripheral perfusion. RRR RESP:  Normal effort.  ABD:  No distention.  MSK:  ** NEURO: CN II-XII grossly intact. RLE DTRs 1+ v LLE 2+ negative supine straight leg raise bilaterally.  Normal toe dorsiflexion exam.   ED Results / Procedures / Treatments   Labs (all labs ordered are listed, but only  abnormal results are displayed) Labs Reviewed - No data to display   EKG   RADIOLOGY  I personally viewed and evaluated these images as part of my medical decision making, as well as reviewing the written report by the radiologist.  ED Provider Interpretation: No acute findings  DG Lumbar Spine 2-3 Views Result Date: 05/05/2024 CLINICAL DATA:  Lower back pain, injury yesterday EXAM: LUMBAR SPINE - 2-3 VIEW COMPARISON:  01/18/2017 FINDINGS: Frontal and lateral views of the lumbar spine are obtained on 3 images. Utilizing previous numbering convention, the last complete disc space is designated as L5/S1. There is reversal of lumbar lordosis with mild kyphosis centered at the L2-3 level. There are no acute displaced fractures. There is mild diffuse lumbar spondylosis, most pronounced at L2-3, L3-4, and L4-5. Mild facet hypertrophic changes at the L5-S1 level. Sacroiliac joints are normal. IMPRESSION: 1. No acute lumbar spine fracture. 2. Lower lumbar spondylosis and facet hypertrophy as above. Electronically Signed   By: Ozell Delores HERO.D.  On: 05/05/2024 16:04     PROCEDURES:  Critical Care performed: No  Procedures   MEDICATIONS ORDERED IN ED: Medications  ketorolac  (TORADOL ) 30 MG/ML injection 60 mg (60 mg Intramuscular Given 05/05/24 1639)  predniSONE  (DELTASONE ) tablet 60 mg (60 mg Oral Given 05/05/24 1638)  tiZANidine (ZANAFLEX) tablet 2 mg (2 mg Oral Given 05/05/24 1638)     IMPRESSION / MDM / ASSESSMENT AND PLAN / ED COURSE  I reviewed the triage vital signs and the nursing notes.                              Differential diagnosis includes, but is not limited to, lumbar strain, lumbar radiculopathy, DDD, hip OA, myalgias  Patient's presentation is most consistent with acute complicated illness / injury requiring diagnostic workup.  Patient's diagnosis is consistent with lumbar strain with RLE radicular symptoms.  Patient with a history of L5-S1 discectomy, presents  endorsing some acute low back pain with RLE referral from the hip to the lateral knee.  Exam is overall reassuring with no red flags noted.  Lumbar x-ray interpreted by me, shows no acute fracture or dislocation.  No acute neurodeficits are appreciated.  Symptoms are likely related to an acute lumbar radiculopathy.  Patient will be discharged home with prescriptions for tizanidine and prednisone . Patient is to follow up with his PCP or neurosurgeon as discussed, as needed or otherwise directed. Patient is given ED precautions to return to the ED for any worsening or new symptoms.   FINAL CLINICAL IMPRESSION(S) / ED DIAGNOSES   Final diagnoses:  Strain of lumbar region, initial encounter  Lumbar radiculopathy     Rx / DC Orders   ED Discharge Orders          Ordered    tiZANidine (ZANAFLEX) 2 MG tablet  Every 8 hours PRN        05/05/24 1644    predniSONE  (DELTASONE ) 20 MG tablet  Q breakfast        05/05/24 1644             Note:  This document was prepared using Dragon voice recognition software and may include unintentional dictation errors.    Loyd Candida LULLA Aldona, PA-C 05/05/24 1706    Willo Dunnings, MD 05/05/24 [phone]

## 2024-05-05 NOTE — Discharge Instructions (Addendum)
 Take the prescription meds as directed.  Follow-up with your neurosurgeon as suggested.  Return to the nearest ED for any concerning changes including leg weakness or saddle anesthesias.

## 2024-05-05 NOTE — ED Triage Notes (Signed)
 Pt comes with lower back pain. Pt states he was helping move stuff yesterday and this started. Pt states worse with movement.

## 2024-05-19 ENCOUNTER — Other Ambulatory Visit: Payer: Self-pay | Admitting: Cardiology

## 2024-05-22 NOTE — Telephone Encounter (Signed)
 Pt of Dr. Cindie. Passed 3rd attempt. Does Dr. Cindie want to refill? Please advise.

## 2024-06-03 ENCOUNTER — Other Ambulatory Visit: Payer: Self-pay | Admitting: Cardiovascular Disease

## 2024-06-03 DIAGNOSIS — I10 Essential (primary) hypertension: Secondary | ICD-10-CM

## 2024-06-03 DIAGNOSIS — I493 Ventricular premature depolarization: Secondary | ICD-10-CM
